# Patient Record
Sex: Female | Born: 1960 | Race: White | Hispanic: No | Smoking: Current every day smoker
Health system: Southern US, Community
[De-identification: ages and names within clinical notes are randomized; demographics above are authoritative.]

## PROBLEM LIST (undated history)

## (undated) DIAGNOSIS — M1990 Unspecified osteoarthritis, unspecified site: Secondary | ICD-10-CM

## (undated) DIAGNOSIS — M810 Age-related osteoporosis without current pathological fracture: Secondary | ICD-10-CM

## (undated) DIAGNOSIS — M719 Bursopathy, unspecified: Secondary | ICD-10-CM

## (undated) DIAGNOSIS — J439 Emphysema, unspecified: Secondary | ICD-10-CM

## (undated) DIAGNOSIS — M419 Scoliosis, unspecified: Secondary | ICD-10-CM

## (undated) HISTORY — PX: CERVICAL LAMINECTOMY: SHX94

## (undated) HISTORY — DX: Bursopathy, unspecified: M71.9

## (undated) HISTORY — DX: Age-related osteoporosis without current pathological fracture: M81.0

## (undated) HISTORY — DX: Emphysema, unspecified: J43.9

## (undated) HISTORY — DX: Unspecified osteoarthritis, unspecified site: M19.90

## (undated) HISTORY — PX: WRIST SURGERY: SHX841

## (undated) HISTORY — DX: Scoliosis, unspecified: M41.9

---

## 2004-11-11 ENCOUNTER — Emergency Department: Payer: Self-pay | Admitting: Internal Medicine

## 2005-08-06 ENCOUNTER — Emergency Department: Payer: Self-pay | Admitting: Emergency Medicine

## 2005-12-03 ENCOUNTER — Emergency Department: Payer: Self-pay | Admitting: Emergency Medicine

## 2006-01-26 ENCOUNTER — Emergency Department: Payer: Self-pay | Admitting: Internal Medicine

## 2006-03-03 ENCOUNTER — Emergency Department: Payer: Self-pay | Admitting: Emergency Medicine

## 2006-03-25 ENCOUNTER — Emergency Department: Payer: Self-pay | Admitting: General Practice

## 2006-06-03 ENCOUNTER — Emergency Department (HOSPITAL_COMMUNITY): Admission: EM | Admit: 2006-06-03 | Discharge: 2006-06-03 | Payer: Self-pay | Admitting: Emergency Medicine

## 2007-10-01 ENCOUNTER — Emergency Department (HOSPITAL_COMMUNITY): Admission: EM | Admit: 2007-10-01 | Discharge: 2007-10-01 | Payer: Self-pay | Admitting: Emergency Medicine

## 2007-10-08 ENCOUNTER — Inpatient Hospital Stay: Payer: Self-pay | Admitting: Internal Medicine

## 2007-10-21 ENCOUNTER — Emergency Department (HOSPITAL_COMMUNITY): Admission: EM | Admit: 2007-10-21 | Discharge: 2007-10-21 | Payer: Self-pay | Admitting: Emergency Medicine

## 2008-02-17 ENCOUNTER — Emergency Department (HOSPITAL_COMMUNITY): Admission: EM | Admit: 2008-02-17 | Discharge: 2008-02-17 | Payer: Self-pay | Admitting: Family Medicine

## 2008-03-21 ENCOUNTER — Ambulatory Visit (HOSPITAL_COMMUNITY): Admission: RE | Admit: 2008-03-21 | Discharge: 2008-03-21 | Payer: Self-pay | Admitting: Family Medicine

## 2008-03-29 ENCOUNTER — Emergency Department (HOSPITAL_COMMUNITY): Admission: EM | Admit: 2008-03-29 | Discharge: 2008-03-29 | Payer: Self-pay | Admitting: Emergency Medicine

## 2008-04-12 ENCOUNTER — Emergency Department (HOSPITAL_COMMUNITY): Admission: EM | Admit: 2008-04-12 | Discharge: 2008-04-12 | Payer: Self-pay | Admitting: Emergency Medicine

## 2008-04-15 ENCOUNTER — Emergency Department (HOSPITAL_COMMUNITY): Admission: EM | Admit: 2008-04-15 | Discharge: 2008-04-15 | Payer: Self-pay | Admitting: Emergency Medicine

## 2008-07-13 ENCOUNTER — Encounter (HOSPITAL_COMMUNITY): Admission: RE | Admit: 2008-07-13 | Discharge: 2008-08-12 | Payer: Self-pay | Admitting: Anesthesiology

## 2009-04-17 ENCOUNTER — Ambulatory Visit (HOSPITAL_COMMUNITY): Admission: RE | Admit: 2009-04-17 | Discharge: 2009-04-17 | Payer: Self-pay | Admitting: Family Medicine

## 2009-05-25 ENCOUNTER — Ambulatory Visit: Payer: Self-pay | Admitting: Pain Medicine

## 2009-08-06 ENCOUNTER — Emergency Department (HOSPITAL_COMMUNITY): Admission: EM | Admit: 2009-08-06 | Discharge: 2009-08-06 | Payer: Self-pay | Admitting: Emergency Medicine

## 2009-08-11 ENCOUNTER — Emergency Department: Payer: Self-pay | Admitting: Unknown Physician Specialty

## 2010-01-05 ENCOUNTER — Emergency Department (HOSPITAL_COMMUNITY): Admission: EM | Admit: 2010-01-05 | Discharge: 2010-01-05 | Payer: Self-pay | Admitting: Emergency Medicine

## 2010-03-23 ENCOUNTER — Emergency Department (HOSPITAL_COMMUNITY): Admission: EM | Admit: 2010-03-23 | Discharge: 2010-03-23 | Payer: Self-pay | Admitting: Emergency Medicine

## 2010-03-26 ENCOUNTER — Encounter (HOSPITAL_COMMUNITY): Admission: RE | Admit: 2010-03-26 | Discharge: 2010-04-25 | Payer: Self-pay | Admitting: Orthopaedic Surgery

## 2010-04-20 ENCOUNTER — Ambulatory Visit (HOSPITAL_COMMUNITY): Admission: RE | Admit: 2010-04-20 | Discharge: 2010-04-20 | Payer: Self-pay | Admitting: Orthopaedic Surgery

## 2010-06-14 ENCOUNTER — Emergency Department (HOSPITAL_COMMUNITY)
Admission: EM | Admit: 2010-06-14 | Discharge: 2010-06-15 | Payer: Self-pay | Source: Home / Self Care | Admitting: Emergency Medicine

## 2010-07-31 ENCOUNTER — Emergency Department (HOSPITAL_COMMUNITY)
Admission: EM | Admit: 2010-07-31 | Discharge: 2010-07-31 | Disposition: A | Discharge status: Home / Self Care | Payer: Medicaid Other | Attending: Emergency Medicine | Admitting: Emergency Medicine

## 2010-07-31 DIAGNOSIS — F411 Generalized anxiety disorder: Secondary | ICD-10-CM | POA: Insufficient documentation

## 2010-07-31 DIAGNOSIS — K219 Gastro-esophageal reflux disease without esophagitis: Secondary | ICD-10-CM | POA: Insufficient documentation

## 2010-07-31 DIAGNOSIS — M79609 Pain in unspecified limb: Secondary | ICD-10-CM | POA: Insufficient documentation

## 2010-07-31 DIAGNOSIS — M545 Low back pain, unspecified: Secondary | ICD-10-CM | POA: Insufficient documentation

## 2010-07-31 DIAGNOSIS — M810 Age-related osteoporosis without current pathological fracture: Secondary | ICD-10-CM | POA: Insufficient documentation

## 2010-07-31 DIAGNOSIS — F17200 Nicotine dependence, unspecified, uncomplicated: Secondary | ICD-10-CM | POA: Insufficient documentation

## 2010-07-31 DIAGNOSIS — J4489 Other specified chronic obstructive pulmonary disease: Secondary | ICD-10-CM | POA: Insufficient documentation

## 2010-07-31 DIAGNOSIS — M533 Sacrococcygeal disorders, not elsewhere classified: Secondary | ICD-10-CM | POA: Insufficient documentation

## 2010-07-31 DIAGNOSIS — G8929 Other chronic pain: Secondary | ICD-10-CM | POA: Insufficient documentation

## 2010-07-31 DIAGNOSIS — J449 Chronic obstructive pulmonary disease, unspecified: Secondary | ICD-10-CM | POA: Insufficient documentation

## 2010-07-31 DIAGNOSIS — Z79899 Other long term (current) drug therapy: Secondary | ICD-10-CM | POA: Insufficient documentation

## 2010-07-31 DIAGNOSIS — M129 Arthropathy, unspecified: Secondary | ICD-10-CM | POA: Insufficient documentation

## 2010-07-31 DIAGNOSIS — M5430 Sciatica, unspecified side: Secondary | ICD-10-CM | POA: Insufficient documentation

## 2010-07-31 LAB — URINALYSIS, ROUTINE W REFLEX MICROSCOPIC
Ketones, ur: NEGATIVE mg/dL
Leukocytes, UA: NEGATIVE
Protein, ur: NEGATIVE mg/dL
Urine Glucose, Fasting: NEGATIVE mg/dL
Urobilinogen, UA: 0.2 mg/dL (ref 0.0–1.0)

## 2010-07-31 LAB — POCT PREGNANCY, URINE: Preg Test, Ur: NEGATIVE

## 2010-07-31 LAB — URINE MICROSCOPIC-ADD ON

## 2010-08-03 LAB — URINE CULTURE
Colony Count: >=100000
Culture  Setup Time: 201202082026

## 2010-08-17 ENCOUNTER — Emergency Department (HOSPITAL_COMMUNITY): Payer: Medicaid Other

## 2010-08-17 ENCOUNTER — Emergency Department (HOSPITAL_COMMUNITY)
Admission: EM | Admit: 2010-08-17 | Discharge: 2010-08-17 | Disposition: A | Discharge status: Home / Self Care | Payer: Medicaid Other | Attending: Emergency Medicine | Admitting: Emergency Medicine

## 2010-08-17 DIAGNOSIS — W19XXXA Unspecified fall, initial encounter: Secondary | ICD-10-CM | POA: Insufficient documentation

## 2010-08-17 DIAGNOSIS — S139XXA Sprain of joints and ligaments of unspecified parts of neck, initial encounter: Secondary | ICD-10-CM | POA: Insufficient documentation

## 2010-08-17 DIAGNOSIS — Y92009 Unspecified place in unspecified non-institutional (private) residence as the place of occurrence of the external cause: Secondary | ICD-10-CM | POA: Insufficient documentation

## 2010-08-17 DIAGNOSIS — S9030XA Contusion of unspecified foot, initial encounter: Secondary | ICD-10-CM | POA: Insufficient documentation

## 2010-08-17 DIAGNOSIS — S0990XA Unspecified injury of head, initial encounter: Secondary | ICD-10-CM | POA: Insufficient documentation

## 2010-08-17 DIAGNOSIS — S20229A Contusion of unspecified back wall of thorax, initial encounter: Secondary | ICD-10-CM | POA: Insufficient documentation

## 2010-08-17 DIAGNOSIS — IMO0002 Reserved for concepts with insufficient information to code with codable children: Secondary | ICD-10-CM | POA: Insufficient documentation

## 2010-08-17 LAB — DIFFERENTIAL
Eosinophils Absolute: 0.1 10*3/uL (ref 0.0–0.7)
Eosinophils Relative: 1 % (ref 0–5)
Lymphs Abs: 4.6 10*3/uL — ABNORMAL HIGH (ref 0.7–4.0)
Monocytes Relative: 11 % (ref 3–12)

## 2010-08-17 LAB — CBC
MCH: 30.7 pg (ref 26.0–34.0)
MCV: 93 fL (ref 78.0–100.0)
Platelets: 454 10*3/uL — ABNORMAL HIGH (ref 150–400)
RDW: 12.8 % (ref 11.5–15.5)

## 2010-08-17 LAB — BASIC METABOLIC PANEL
BUN: 7 mg/dL (ref 6–23)
Calcium: 9.2 mg/dL (ref 8.4–10.5)
Creatinine, Ser: 0.83 mg/dL (ref 0.4–1.2)
GFR calc non Af Amer: >60 mL/min (ref >60)
Glucose, Bld: 84 mg/dL (ref 70–99)

## 2010-09-13 ENCOUNTER — Emergency Department (HOSPITAL_COMMUNITY)
Admission: EM | Admit: 2010-09-13 | Discharge: 2010-09-14 | Disposition: A | Discharge status: Home / Self Care | Payer: Medicaid Other | Source: Home / Self Care | Attending: Emergency Medicine | Admitting: Emergency Medicine

## 2010-09-13 DIAGNOSIS — IMO0001 Reserved for inherently not codable concepts without codable children: Secondary | ICD-10-CM | POA: Insufficient documentation

## 2010-09-13 DIAGNOSIS — G8929 Other chronic pain: Secondary | ICD-10-CM | POA: Insufficient documentation

## 2010-09-13 DIAGNOSIS — F411 Generalized anxiety disorder: Secondary | ICD-10-CM | POA: Insufficient documentation

## 2010-09-13 DIAGNOSIS — R51 Headache: Secondary | ICD-10-CM | POA: Insufficient documentation

## 2010-09-13 DIAGNOSIS — M25519 Pain in unspecified shoulder: Secondary | ICD-10-CM | POA: Insufficient documentation

## 2010-09-13 DIAGNOSIS — Z79899 Other long term (current) drug therapy: Secondary | ICD-10-CM | POA: Insufficient documentation

## 2010-09-13 DIAGNOSIS — Z8739 Personal history of other diseases of the musculoskeletal system and connective tissue: Secondary | ICD-10-CM | POA: Insufficient documentation

## 2010-09-13 DIAGNOSIS — M810 Age-related osteoporosis without current pathological fracture: Secondary | ICD-10-CM | POA: Insufficient documentation

## 2010-09-13 DIAGNOSIS — J4489 Other specified chronic obstructive pulmonary disease: Secondary | ICD-10-CM | POA: Insufficient documentation

## 2010-09-13 DIAGNOSIS — W19XXXA Unspecified fall, initial encounter: Secondary | ICD-10-CM | POA: Insufficient documentation

## 2010-09-13 DIAGNOSIS — K219 Gastro-esophageal reflux disease without esophagitis: Secondary | ICD-10-CM | POA: Insufficient documentation

## 2010-09-13 DIAGNOSIS — J449 Chronic obstructive pulmonary disease, unspecified: Secondary | ICD-10-CM | POA: Insufficient documentation

## 2010-09-13 DIAGNOSIS — M79609 Pain in unspecified limb: Secondary | ICD-10-CM | POA: Insufficient documentation

## 2010-09-13 DIAGNOSIS — M5430 Sciatica, unspecified side: Secondary | ICD-10-CM | POA: Insufficient documentation

## 2010-09-13 DIAGNOSIS — M542 Cervicalgia: Secondary | ICD-10-CM | POA: Insufficient documentation

## 2010-09-14 ENCOUNTER — Emergency Department (HOSPITAL_COMMUNITY)
Admission: EM | Admit: 2010-09-14 | Discharge: 2010-09-14 | Disposition: A | Discharge status: Home / Self Care | Payer: Medicaid Other | Attending: Emergency Medicine | Admitting: Emergency Medicine

## 2010-09-14 ENCOUNTER — Emergency Department (HOSPITAL_COMMUNITY): Payer: Medicaid Other

## 2011-05-10 ENCOUNTER — Emergency Department: Payer: Self-pay | Admitting: Emergency Medicine

## 2011-07-30 ENCOUNTER — Emergency Department (HOSPITAL_COMMUNITY)
Admission: EM | Admit: 2011-07-30 | Discharge: 2011-07-30 | Disposition: A | Discharge status: Home / Self Care | Payer: Medicaid Other | Attending: Emergency Medicine | Admitting: Emergency Medicine

## 2011-07-30 ENCOUNTER — Emergency Department (HOSPITAL_COMMUNITY): Payer: Medicaid Other

## 2011-07-30 ENCOUNTER — Encounter (HOSPITAL_COMMUNITY): Payer: Self-pay | Admitting: *Deleted

## 2011-07-30 DIAGNOSIS — R0789 Other chest pain: Secondary | ICD-10-CM

## 2011-07-30 DIAGNOSIS — R109 Unspecified abdominal pain: Secondary | ICD-10-CM | POA: Insufficient documentation

## 2011-07-30 DIAGNOSIS — M542 Cervicalgia: Secondary | ICD-10-CM | POA: Insufficient documentation

## 2011-07-30 DIAGNOSIS — R10812 Left upper quadrant abdominal tenderness: Secondary | ICD-10-CM | POA: Insufficient documentation

## 2011-07-30 DIAGNOSIS — R071 Chest pain on breathing: Secondary | ICD-10-CM | POA: Insufficient documentation

## 2011-07-30 DIAGNOSIS — F17200 Nicotine dependence, unspecified, uncomplicated: Secondary | ICD-10-CM | POA: Insufficient documentation

## 2011-07-30 LAB — COMPREHENSIVE METABOLIC PANEL
Albumin: 4.3 g/dL (ref 3.5–5.2)
BUN: 15 mg/dL (ref 6–23)
CO2: 26 mEq/L (ref 19–32)
Chloride: 99 mEq/L (ref 96–112)
Creatinine, Ser: 0.63 mg/dL (ref 0.50–1.10)
GFR calc Af Amer: >90 mL/min (ref >90)
GFR calc non Af Amer: >90 mL/min (ref >90)
Glucose, Bld: 100 mg/dL — ABNORMAL HIGH (ref 70–99)
Total Bilirubin: 0.3 mg/dL (ref 0.3–1.2)

## 2011-07-30 LAB — URINALYSIS, ROUTINE W REFLEX MICROSCOPIC
Ketones, ur: NEGATIVE mg/dL
Leukocytes, UA: NEGATIVE
Nitrite: NEGATIVE
Protein, ur: NEGATIVE mg/dL
Urobilinogen, UA: 0.2 mg/dL (ref 0.0–1.0)

## 2011-07-30 LAB — DIFFERENTIAL
Basophils Relative: 0 % (ref 0–1)
Monocytes Absolute: 1.1 10*3/uL — ABNORMAL HIGH (ref 0.1–1.0)
Monocytes Relative: 10 % (ref 3–12)
Neutro Abs: 5.4 10*3/uL (ref 1.7–7.7)

## 2011-07-30 LAB — LIPASE, BLOOD: Lipase: 54 U/L (ref 11–59)

## 2011-07-30 LAB — CBC
HCT: 39.5 % (ref 36.0–46.0)
Hemoglobin: 12.9 g/dL (ref 12.0–15.0)
MCHC: 32.7 g/dL (ref 30.0–36.0)

## 2011-07-30 MED ORDER — MORPHINE SULFATE 4 MG/ML IJ SOLN
6.0000 mg | Freq: Once | INTRAMUSCULAR | Status: AC
  Administered 2011-07-30: 6 mg via INTRAVENOUS
  Filled 2011-07-30: qty 2

## 2011-07-30 MED ORDER — MORPHINE SULFATE 4 MG/ML IJ SOLN: 6.0000 mg | Freq: Once | INTRAMUSCULAR | Status: DC

## 2011-07-30 MED ORDER — OXYCODONE-ACETAMINOPHEN 5-325 MG PO TABS: 1.0000 | ORAL_TABLET | Freq: Four times a day (QID) | ORAL | Status: AC | PRN

## 2011-07-30 MED ORDER — HYDROMORPHONE HCL PF 1 MG/ML IJ SOLN
1.0000 mg | Freq: Once | INTRAMUSCULAR | Status: DC
  Filled 2011-07-30: qty 1

## 2011-07-30 MED ORDER — ONDANSETRON HCL 4 MG/2ML IJ SOLN
4.0000 mg | Freq: Once | INTRAMUSCULAR | Status: AC
  Administered 2011-07-30: 4 mg via INTRAVENOUS
  Filled 2011-07-30: qty 2

## 2011-07-30 MED ORDER — MORPHINE SULFATE 4 MG/ML IJ SOLN
6.0000 mg | Freq: Once | INTRAMUSCULAR | Status: AC
  Administered 2011-07-30: 6 mg via INTRAVENOUS
  Filled 2011-07-30: qty 1

## 2011-07-30 MED ORDER — SODIUM CHLORIDE 0.9 % IV SOLN
Freq: Once | INTRAVENOUS | Status: AC
  Administered 2011-07-30: 16:00:00 via INTRAVENOUS

## 2011-07-30 NOTE — ED Provider Notes (Signed)
History    This chart was scribed for Laurie Lennert, MD, MD by Laurie Robles. The patient was seen in room APA10 and the patient's care was started at 3:45PM.   CSN: [id number]  Arrival date & time 07/30/11  1513   First MD Initiated Contact with Patient 07/30/11 1542      Chief Complaint  Patient presents with  . Nephrolithiasis    (Consider location/radiation/quality/duration/timing/severity/associated sxs/prior treatment) Patient is a 51 y.o. female presenting with flank pain. The history is provided by the patient.  Flank Pain This is a new problem. The current episode started more than 2 days ago. The problem occurs constantly. The problem has not changed since onset.Pertinent negatives include no chest pain. She has tried nothing for the symptoms.   Laurie Robles is a 51 y.o. female who presents to the Emergency Department complaining of moderate nephrolithiasis onset 1 week ago. Pt reports going to PCP yesterday and had ultrasound results that showed kidney stones. Pt reports breathing deeply aggravates the pain. Pt reports vomiting a couple of times last week. She denies having history of kidney stones.   History reviewed. No pertinent past medical history.  Past Surgical History  Procedure Date  . Cervical laminectomy     History reviewed. No pertinent family history.  History  Substance Use Topics  . Smoking status: Current Everyday Smoker  . Smokeless tobacco: Not on file  . Alcohol Use: No    OB History    Grav Para Term Preterm Abortions TAB SAB Ect Mult Living                  Review of Systems  Cardiovascular: Negative for chest pain.  Genitourinary: Positive for flank pain.  All other systems reviewed and are negative.  10 Systems reviewed and are negative for acute change except as noted in the HPI.   Allergies  Review of patient's allergies indicates not on file.  Home Medications  No current outpatient prescriptions on file.  BP 153/114   Pulse 106  Temp(Src) 98 F (36.7 C) (Oral)  Resp 24  Ht 5\' 3"  (1.6 m)  Wt 126 lb (57.153 kg)  BMI 22.32 kg/m2  SpO2 100%  Physical Exam  Nursing note and vitals reviewed. Constitutional: She is oriented to person, place, and time. She appears well-developed and well-nourished. No distress.  HENT:  Head: Normocephalic and atraumatic.  Eyes: Conjunctivae and EOM are normal. No scleral icterus.  Neck: Neck supple. No thyromegaly present.  Cardiovascular: Normal rate and regular rhythm.  Exam reveals no gallop and no friction rub.   No murmur heard. Pulmonary/Chest: No stridor. She has no wheezes. She has no rales. She exhibits no tenderness.  Abdominal: She exhibits no distension. There is tenderness (LUQ). There is no rebound.  Musculoskeletal: Normal range of motion. She exhibits no edema.  Lymphadenopathy:    She has no cervical adenopathy.  Neurological: She is oriented to person, place, and time. Coordination normal.  Skin: No rash noted. No erythema.  Psychiatric: She has a normal mood and affect. Her behavior is normal.    ED Course  Procedures (including critical care time)  DIAGNOSTIC STUDIES: Oxygen Saturation is 100% on room air, normal by my interpretation.    COORDINATION OF CARE:  3:50 PM EDP ordered medication: dilaudid 1 mg and zofran 4 mg  Labs Reviewed  DIFFERENTIAL - Abnormal; Notable for the following:    Monocytes Absolute 1.1 (*)    All other components within  normal limits  COMPREHENSIVE METABOLIC PANEL - Abnormal; Notable for the following:    Glucose, Bld 100 (*)    All other components within normal limits  URINALYSIS, ROUTINE W REFLEX MICROSCOPIC - Abnormal; Notable for the following:    Specific Gravity, Urine >1.030 (*)    Hgb urine dipstick SMALL (*)    All other components within normal limits  URINE MICROSCOPIC-ADD ON - Abnormal; Notable for the following:    Squamous Epithelial / LPF FEW (*)    All other components within normal limits    CBC  LIPASE, BLOOD   Ct Abdomen Pelvis Wo Contrast  07/30/2011  *RADIOLOGY REPORT*  Clinical Data: Low back pain post fall.  History of nephrolithiasis.  CT ABDOMEN AND PELVIS WITHOUT CONTRAST  Technique:  Multidetector CT imaging of the abdomen and pelvis was performed following the standard protocol without intravenous contrast.  Comparison: None.  Findings: Visualized lung bases clear.  Unremarkable uninfused evaluation of liver, gallbladder, spleen, adrenal glands, pancreas.  No nephrolithiasis.  No hydronephrosis.  Renal parenchyma unremarkable.  Ureters decompressed.  Urinary bladder physiologically distended.  Small bowel and stomach are decompressed. Normal appendix.  The colon is nondilated, unremarkable.  Left pelvic phleboliths. Uterus and adnexal regions unremarkable.  No ascites.  No free air. Minimal calcified plaque in the abdominal aorta without aneurysm. Lumbar spine and pelvis intact.  IMPRESSION:  1.  No acute abdominal process. 2. Negative for nephrolithiasis or hydronephrosis.  Original Report Authenticated By: Osa Craver, M.D.   Ct Cervical Spine Wo Contrast  07/30/2011  *RADIOLOGY REPORT*  Clinical Data: Larey Seat, neck pain.  Previous surgery.  CT CERVICAL SPINE WITHOUT CONTRAST  Technique:  Multidetector CT imaging of the cervical spine was performed. Multiplanar CT image reconstructions were also generated.  Comparison: 09/14/2010.  Findings: The patient is status post C4-C7 ACDF with anterior plating.  As demonstrated previously, the C4-5 and C5-6 levels are solidly fused.  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  Both C7 screws are loose and the right screw is fractured.  This is not an acute finding.  There is no visible cervical spine fracture or traumatic subluxation.  No neck masses are seen.  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  IMPRESSION: No acute cervical spine fracture.  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  Original Report  Authenticated By: Elsie Stain, M.D.     No diagnosis found.  Chest wall pain  MDM  Chest wall pain Patient was examined at discharge. Patient was still having some pain in her left anterior distal chest and some in her left flank. Patient was very tender to palpation to her chest and flank.       Laurie Lennert, MD 07/30/11 1901

## 2011-07-30 NOTE — ED Notes (Signed)
edp aware pt would like to speak with him. Nad.

## 2011-07-30 NOTE — ED Notes (Signed)
U/s done yesterday and shown to have kidney stones on left.

## 2011-10-02 IMAGING — CR DG LUMBAR SPINE COMPLETE 4+V
5 series · 5 of 5 positions shown · non-contrast
Comparison: None.

CLINICAL DATA: Fall.  Low back injury and pain.

LUMBAR SPINE - COMPLETE 4+ VIEW

[view not recorded (1 of 5)]
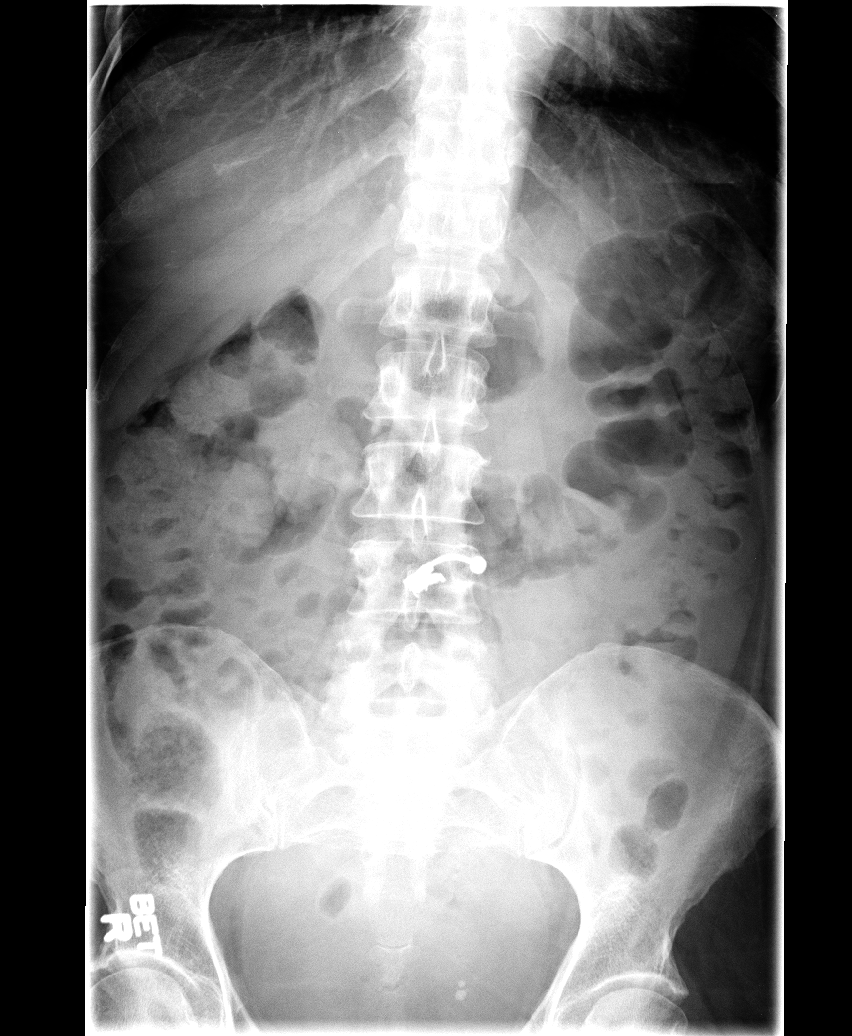

[view not recorded (2 of 5)]
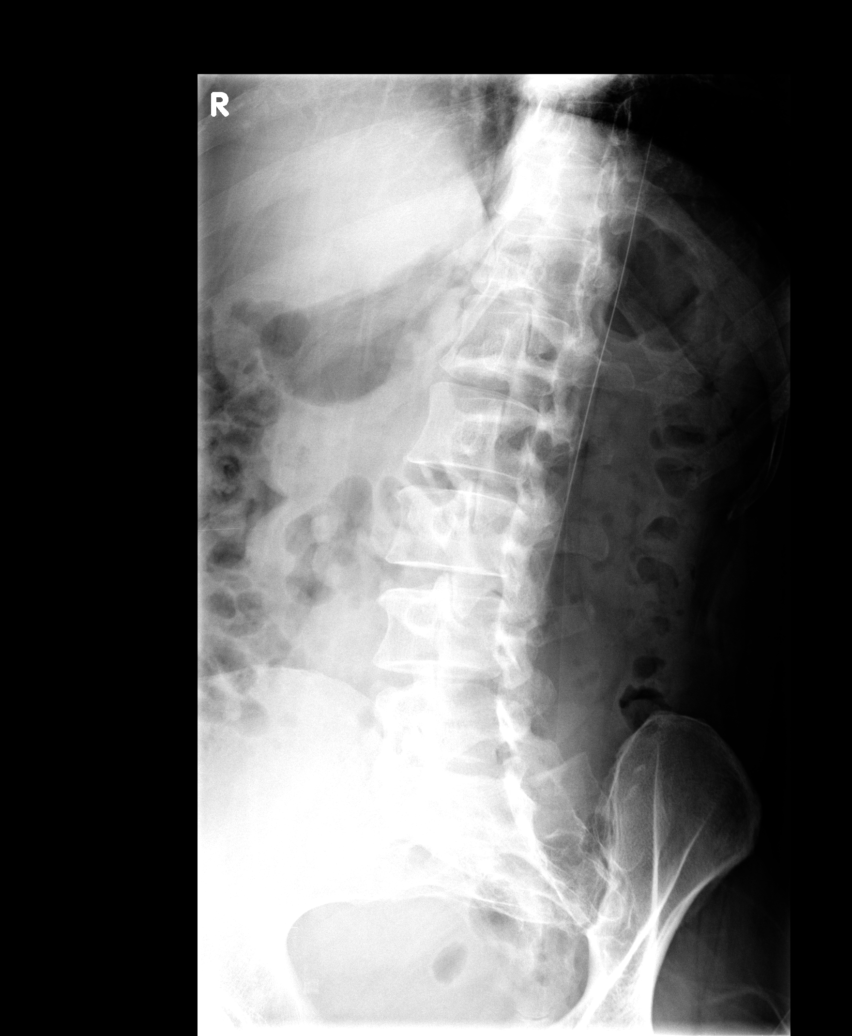

[view not recorded (3 of 5)]
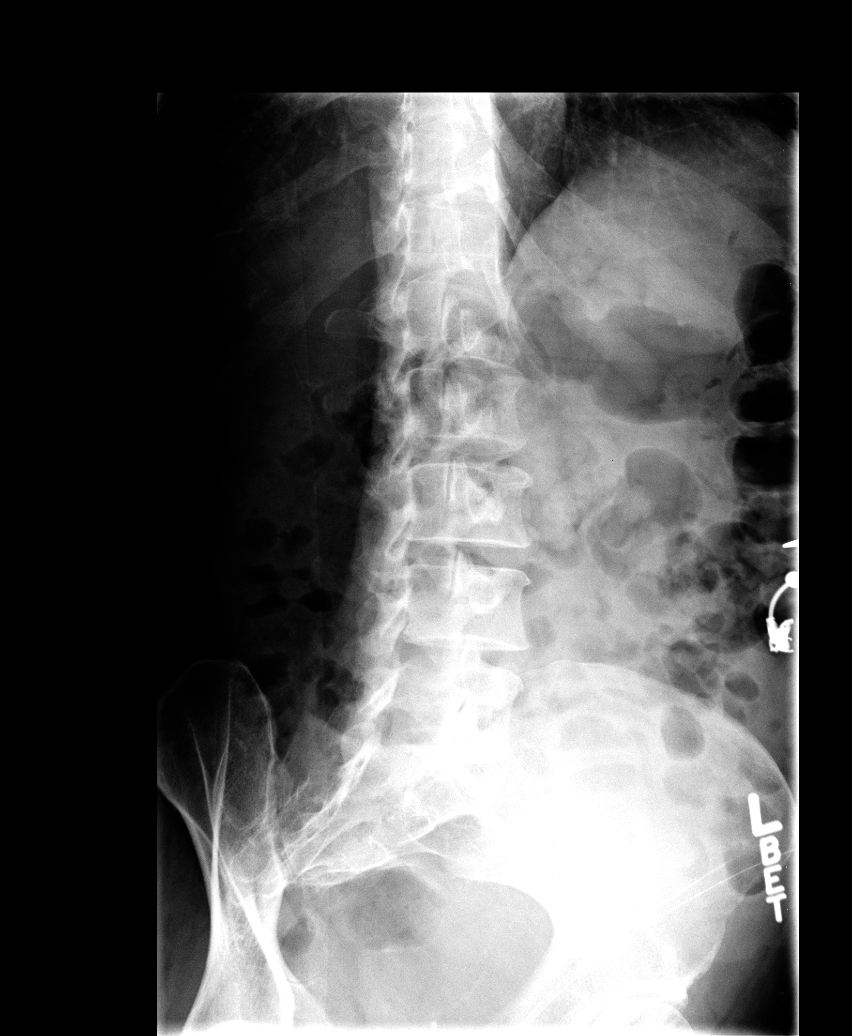

[view not recorded (4 of 5)]
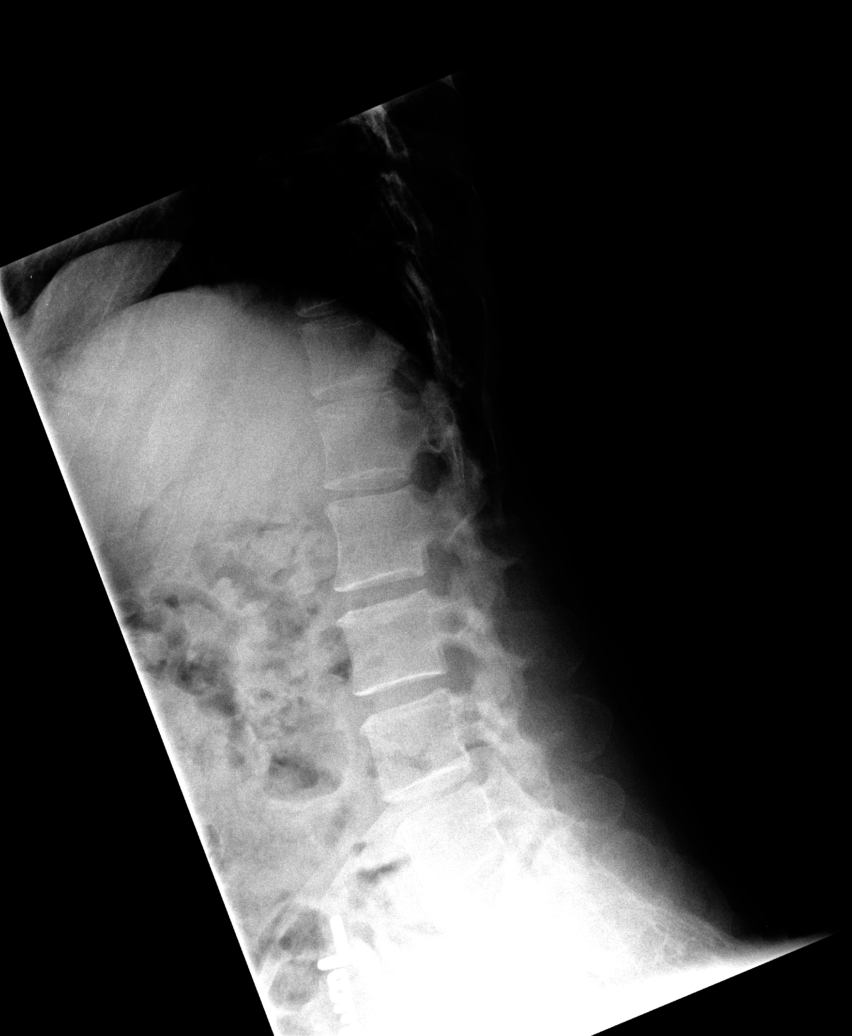

[view not recorded (5 of 5)]
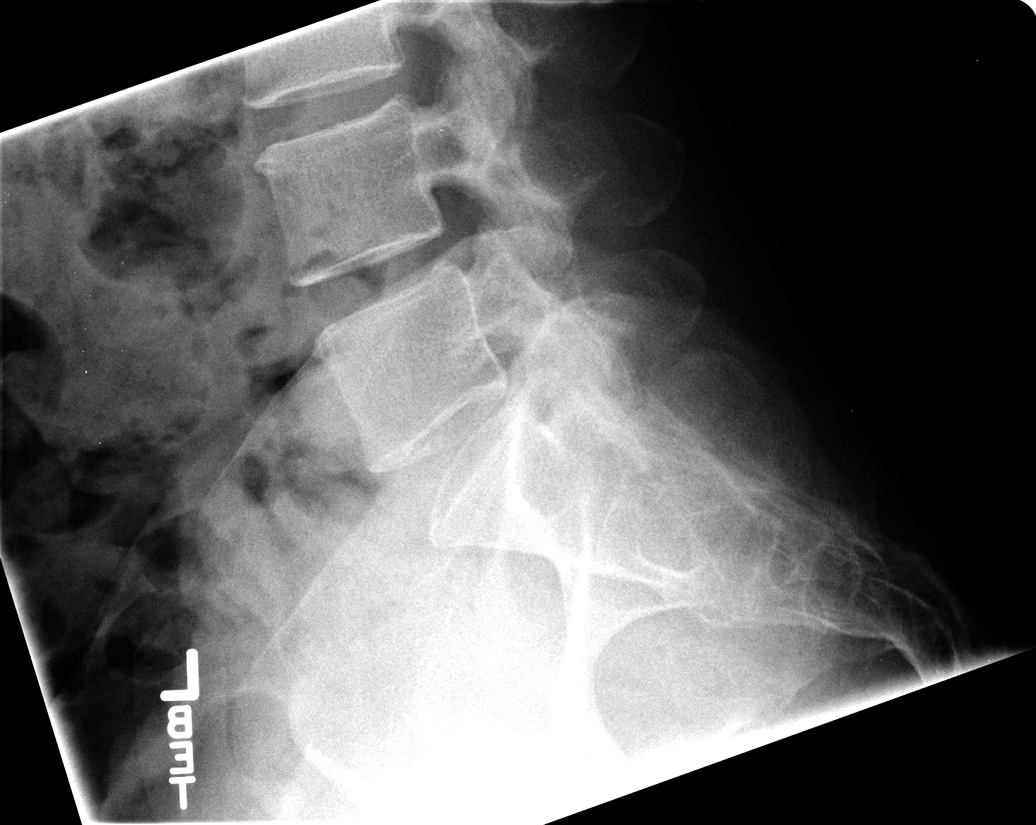

[5 of 5 positions shown; findings below may reference images not displayed]

FINDINGS: No evidence of acute fracture, spondylolysis, or
spondylolisthesis.

Mild vertebral endplate osteophyte formation is seen without disc
space narrowing.  No evidence of facet arthropathy or other
significant bone abnormality.
IMPRESSION: 1.  No acute findings.
2.  Early lumbar degenerative changes.

## 2013-11-19 ENCOUNTER — Emergency Department: Payer: Self-pay | Admitting: Emergency Medicine

## 2014-02-05 ENCOUNTER — Emergency Department: Payer: Self-pay | Admitting: Student

## 2014-02-05 LAB — BASIC METABOLIC PANEL
ANION GAP: 5 — AB (ref 7–16)
BUN: 10 mg/dL (ref 7–18)
CHLORIDE: 105 mmol/L (ref 98–107)
CREATININE: 0.88 mg/dL (ref 0.60–1.30)
Calcium, Total: 9 mg/dL (ref 8.5–10.1)
Co2: 29 mmol/L (ref 21–32)
EGFR (African American): >60
Glucose: 93 mg/dL (ref 65–99)
OSMOLALITY: 276 (ref 275–301)
Potassium: 3.5 mmol/L (ref 3.5–5.1)
SODIUM: 139 mmol/L (ref 136–145)

## 2014-02-05 LAB — ETHANOL
Ethanol %: 0.13 % — ABNORMAL HIGH (ref 0.000–0.080)
Ethanol: 130 mg/dL

## 2014-02-05 LAB — CBC
HCT: 38.1 % (ref 35.0–47.0)
HGB: 12.3 g/dL (ref 12.0–16.0)
MCH: 31.4 pg (ref 26.0–34.0)
MCHC: 32.4 g/dL (ref 32.0–36.0)
MCV: 97 fL (ref 80–100)
Platelet: 344 10*3/uL (ref 150–440)
RBC: 3.93 10*6/uL (ref 3.80–5.20)
RDW: 13.4 % (ref 11.5–14.5)
WBC: 11.8 10*3/uL — ABNORMAL HIGH (ref 3.6–11.0)

## 2015-03-08 ENCOUNTER — Encounter: Payer: Self-pay | Admitting: Emergency Medicine

## 2015-03-08 ENCOUNTER — Emergency Department
Admission: EM | Admit: 2015-03-08 | Discharge: 2015-03-08 | Disposition: A | Discharge status: Home / Self Care | Payer: Medicaid Other | Attending: Student | Admitting: Student

## 2015-03-08 ENCOUNTER — Emergency Department: Payer: Medicaid Other

## 2015-03-08 DIAGNOSIS — Z720 Tobacco use: Secondary | ICD-10-CM | POA: Diagnosis not present

## 2015-03-08 DIAGNOSIS — S92002A Unspecified fracture of left calcaneus, initial encounter for closed fracture: Secondary | ICD-10-CM | POA: Diagnosis not present

## 2015-03-08 DIAGNOSIS — F419 Anxiety disorder, unspecified: Secondary | ICD-10-CM | POA: Diagnosis not present

## 2015-03-08 DIAGNOSIS — X58XXXA Exposure to other specified factors, initial encounter: Secondary | ICD-10-CM | POA: Insufficient documentation

## 2015-03-08 DIAGNOSIS — Y9389 Activity, other specified: Secondary | ICD-10-CM | POA: Insufficient documentation

## 2015-03-08 DIAGNOSIS — Z880 Allergy status to penicillin: Secondary | ICD-10-CM | POA: Insufficient documentation

## 2015-03-08 DIAGNOSIS — Z7951 Long term (current) use of inhaled steroids: Secondary | ICD-10-CM | POA: Insufficient documentation

## 2015-03-08 DIAGNOSIS — Z79899 Other long term (current) drug therapy: Secondary | ICD-10-CM | POA: Diagnosis not present

## 2015-03-08 DIAGNOSIS — Y998 Other external cause status: Secondary | ICD-10-CM | POA: Diagnosis not present

## 2015-03-08 DIAGNOSIS — Z792 Long term (current) use of antibiotics: Secondary | ICD-10-CM | POA: Insufficient documentation

## 2015-03-08 DIAGNOSIS — S9032XA Contusion of left foot, initial encounter: Secondary | ICD-10-CM | POA: Diagnosis not present

## 2015-03-08 DIAGNOSIS — Y92832 Beach as the place of occurrence of the external cause: Secondary | ICD-10-CM | POA: Diagnosis not present

## 2015-03-08 DIAGNOSIS — S8992XA Unspecified injury of left lower leg, initial encounter: Secondary | ICD-10-CM | POA: Diagnosis present

## 2015-03-08 MED ORDER — OXYCODONE-ACETAMINOPHEN 5-325 MG PO TABS
1.0000 | ORAL_TABLET | Freq: Once | ORAL | Status: AC
  Administered 2015-03-08: 1 via ORAL
  Filled 2015-03-08: qty 1

## 2015-03-08 MED ORDER — OXYCODONE-ACETAMINOPHEN 7.5-325 MG PO TABS: 1.0000 | ORAL_TABLET | Freq: Four times a day (QID) | ORAL | Status: DC | PRN

## 2015-03-08 MED ORDER — IBUPROFEN 800 MG PO TABS
800.0000 mg | ORAL_TABLET | Freq: Once | ORAL | Status: AC
  Administered 2015-03-08: 800 mg via ORAL
  Filled 2015-03-08: qty 1

## 2015-03-08 MED ORDER — IBUPROFEN 800 MG PO TABS: 800.0000 mg | ORAL_TABLET | Freq: Three times a day (TID) | ORAL | Status: DC | PRN

## 2015-03-08 NOTE — Discharge Instructions (Signed)
Use crutches and follow up with [facility].

## 2015-03-08 NOTE — ED Notes (Signed)
Pt states [place], states she ran out of pain meds.

## 2015-03-08 NOTE — ED Provider Notes (Signed)
[facility] Emergency Department Provider Note  ____________________________________________  Time seen: Approximately 2:04 PM  I have reviewed the triage vital signs and the nursing notes.   HISTORY  Chief Complaint Leg Pain    HPI Laurie Robles is a 54 y.o. female patient complain of pain to the left foot secondary to blunt [place]. Patient states she was seen at urgent care clinic x-ray showed a broken foot. Patient has the x-ray films on the disks. She does not follow orthopedics and states she is out of pain medications. Patient is rating the pain as 8/10.   History reviewed. No pertinent past medical history.  There are no active problems to display for this patient.   Past Surgical History  Procedure Laterality Date  . Cervical laminectomy      Current Outpatient Rx  Name  Route  Sig  Dispense  Refill  . Aclidinium Bromide (TUDORZA PRESSAIR) 400 MCG/ACT AEPB   Inhalation   Inhale 1 puff into the lungs 2 (two) times daily.         Marland Kitchen ALPRAZolam (XANAX) 1 MG tablet   Oral   Take 1 mg by mouth 4 (four) times daily.         Marland Kitchen arformoterol (BROVANA) 15 MCG/2ML NEBU   Nebulization   Take 15 mcg by nebulization 2 (two) times daily.         . carisoprodol (SOMA) 350 MG tablet   Oral   Take 350 mg by mouth 4 (four) times daily.         . cetirizine (ZYRTEC) 10 MG tablet   Oral   Take 10 mg by mouth daily.         . ciprofloxacin (CIPRO) 500 MG tablet   Oral   Take 500 mg by mouth 2 (two) times daily.         Marland Kitchen desvenlafaxine (PRISTIQ) 50 MG 24 hr tablet   Oral   Take 50 mg by mouth 2 (two) times daily.         Marland Kitchen estrogen, conjugated,-medroxyprogesterone (PREMPRO) 0.625-2.5 MG per tablet   Oral   Take 1 tablet by mouth every evening.         . etodolac (LODINE) 400 MG tablet   Oral   Take 400 mg by mouth 2 (two) times daily. **takes as needed for pain**         .  HYDROcodone-acetaminophen (NORCO) 7.5-325 MG per tablet   Oral   Take 1 tablet by mouth every 4 (four) hours as needed. For pain. *Must last 30 days**         . ibuprofen (ADVIL,MOTRIN) 800 MG tablet   Oral   Take 1 tablet (800 mg total) by mouth every 8 (eight) hours as needed for moderate pain.   15 tablet   0   . olopatadine (PATANOL) 0.1 % ophthalmic solution   Both Eyes   Place 1 drop into both eyes 2 (two) times daily.         . Olopatadine HCl (PATADAY) 0.2 % SOLN   Ophthalmic   Apply 1 drop to eye 2 (two) times daily.         Marland Kitchen omeprazole (PRILOSEC) 20 MG capsule   Oral   Take 20 mg by mouth daily.         Marland Kitchen oxyCODONE-acetaminophen (PERCOCET) 7.5-325 MG per tablet   Oral   Take 1 tablet by mouth every 6 (  six) hours as needed for severe pain.   12 tablet   0   . pregabalin (LYRICA) 300 MG capsule   Oral   Take 300 mg by mouth 4 (four) times daily.         . traMADol (ULTRAM) 50 MG tablet   Oral   Take 50 mg by mouth 4 (four) times daily as needed. For pain/headaches           Allergies Dilaudid; Erythromycin; and Penicillins  History reviewed. No pertinent family history.  Social History Social History  Substance Use Topics  . Smoking status: Current Every Day Smoker  . Smokeless tobacco: None  . Alcohol Use: No    Review of Systems Constitutional: No fever/chills Eyes: No visual changes. ENT: No sore throat. Cardiovascular: Denies chest pain. Respiratory: Denies shortness of breath. Gastrointestinal: No abdominal pain.  No nausea, no vomiting.  No diarrhea.  No constipation. Genitourinary: Negative for dysuria. Musculoskeletal: Left foot pain Skin: Negative for rash. Neurological: Negative for headaches, focal weakness or numbness. Psychiatric:Anxiety Endocrine: Hematological/Lymphatic: Allergic/Immunilogical: The medication list  10-point ROS otherwise negative.  ____________________________________________   PHYSICAL  EXAM:  VITAL SIGNS: ED Triage Vitals  Enc Vitals Group     BP 03/08/15 1217 145/84 mmHg     Pulse Rate 03/08/15 1217 102     Resp 03/08/15 1217 18     Temp 03/08/15 1217 98.7 F (37.1 C)     Temp Source 03/08/15 1217 Oral     SpO2 03/08/15 1217 100 %     Weight 03/08/15 1217 112 lb (50.803 kg)     Height 03/08/15 1217 5\' 3"  (1.6 m)     Head Cir --      Peak Flow --      Pain Score 03/08/15 1217 10     Pain Loc --      Pain Edu? --      Excl. in GC? --     Constitutional: Alert and oriented. Well appearing and in no acute distress. Anxious Eyes: Conjunctivae are normal. PERRL. EOMI. Head: Atraumatic. Nose: No congestion/rhinnorhea. Mouth/Throat: Mucous membranes are moist.  Oropharynx non-erythematous. Neck: No stridor.   Hematological/Lymphatic/Immunilogical: No cervical lymphadenopathy. Cardiovascular: Normal rate, regular rhythm. Grossly normal heart sounds.  Good peripheral circulation. Respiratory: Normal respiratory effort.  No retractions. Lungs CTAB. Gastrointestinal: Soft and nontender. No distention. No abdominal bruits. No CVA tenderness. Musculoskeletal: He edema left foot with no obvious deformity.  Neurologic:  Normal speech and language. No gross focal neurologic deficits are appreciated. No gait instability. Skin:  Skin is warm, dry and intact. No rash noted. Ecchymosis Psychiatric: Mood and affect are normal. Speech and behavior are normal.  ____________________________________________   LABS (all labs ordered are listed, but only abnormal results are displayed)  Labs Reviewed - No data to display ____________________________________________  EKG   ____________________________________________  RADIOLOGY plantar calcaneal fracture. I, Joni Reining, personally viewed and evaluated these images (plain radiographs) as part of my medical decision making.   ____________________________________________   PROCEDURES  Procedure(s) performed:  None  Critical Care performed: No  ____________________________________________   INITIAL IMPRESSION / ASSESSMENT AND PLAN / ED COURSE  Pertinent labs & imaging results that were available during my care of the patient were reviewed by me and considered in my medical decision making (see chart for details).  Left fractured heel. Discussed negative tib-fib fracture with patient. Advised patient to follow-up in orthopedic. Advised to call in the morning for appointment time. Patient will  replace on the non-weightbearing status and given Percocets and ibuprofen. ____________________________________________   FINAL CLINICAL IMPRESSION(S) / ED DIAGNOSES  Final diagnoses:  Heel fracture, left, closed, initial encounter      Joni Reining, PA-C 03/08/15 1531  Gayla Doss, MD 03/08/15 1555

## 2015-04-05 ENCOUNTER — Encounter: Payer: Self-pay | Admitting: Emergency Medicine

## 2015-04-05 ENCOUNTER — Emergency Department: Payer: Medicaid Other

## 2015-04-05 ENCOUNTER — Emergency Department
Admission: EM | Admit: 2015-04-05 | Discharge: 2015-04-05 | Disposition: A | Discharge status: Home / Self Care | Payer: Medicaid Other | Attending: Emergency Medicine | Admitting: Emergency Medicine

## 2015-04-05 DIAGNOSIS — X58XXXD Exposure to other specified factors, subsequent encounter: Secondary | ICD-10-CM | POA: Insufficient documentation

## 2015-04-05 DIAGNOSIS — S92002D Unspecified fracture of left calcaneus, subsequent encounter for fracture with routine healing: Secondary | ICD-10-CM | POA: Diagnosis not present

## 2015-04-05 DIAGNOSIS — Z792 Long term (current) use of antibiotics: Secondary | ICD-10-CM | POA: Insufficient documentation

## 2015-04-05 DIAGNOSIS — S8992XD Unspecified injury of left lower leg, subsequent encounter: Secondary | ICD-10-CM | POA: Diagnosis present

## 2015-04-05 DIAGNOSIS — R110 Nausea: Secondary | ICD-10-CM | POA: Diagnosis not present

## 2015-04-05 DIAGNOSIS — Z880 Allergy status to penicillin: Secondary | ICD-10-CM | POA: Insufficient documentation

## 2015-04-05 DIAGNOSIS — Z79899 Other long term (current) drug therapy: Secondary | ICD-10-CM | POA: Diagnosis not present

## 2015-04-05 DIAGNOSIS — Z720 Tobacco use: Secondary | ICD-10-CM | POA: Insufficient documentation

## 2015-04-05 DIAGNOSIS — Z7951 Long term (current) use of inhaled steroids: Secondary | ICD-10-CM | POA: Diagnosis not present

## 2015-04-05 MED ORDER — OXYCODONE-ACETAMINOPHEN 5-325 MG PO TABS
1.0000 | ORAL_TABLET | Freq: Once | ORAL | Status: AC
  Administered 2015-04-05: 1 via ORAL
  Filled 2015-04-05: qty 1

## 2015-04-05 MED ORDER — OXYCODONE-ACETAMINOPHEN 5-325 MG PO TABS: 1.0000 | ORAL_TABLET | ORAL | Status: DC | PRN

## 2015-04-05 MED ORDER — ONDANSETRON 4 MG PO TBDP
4.0000 mg | ORAL_TABLET | Freq: Once | ORAL | Status: DC
  Filled 2015-04-05: qty 1

## 2015-04-05 NOTE — ED Notes (Signed)
Says she was refused by [facility] and wants her cast off.

## 2015-04-05 NOTE — ED Provider Notes (Signed)
[facility] Emergency Department Provider Note  ____________________________________________  Time seen: Approximately 1:44 PM  I have reviewed the triage vital signs and the nursing notes.   HISTORY  Chief Complaint Leg Pain   HPI Laurie Robles is a 54 y.o. female is here with complaint of left foot pain. Patient states she was seen in the emergency room and also at [facility] for a fractured foot that [place]. Patient states that since that timeshe has lost her footing and has caused a reinjury to her foot. She is here today because she wants her cast taken off and states that she cannot get medication from [facility] as they "have refused her". Patient rates her pain as 8 out of 10 at present.   History reviewed. No pertinent past medical history.  There are no active problems to display for this patient.   Past Surgical History  Procedure Laterality Date  . Cervical laminectomy      Current Outpatient Rx  Name  Route  Sig  Dispense  Refill  . Aclidinium Bromide (TUDORZA PRESSAIR) 400 MCG/ACT AEPB   Inhalation   Inhale 1 puff into the lungs 2 (two) times daily.         Marland Kitchen. ALPRAZolam (XANAX) 1 MG tablet   Oral   Take 1 mg by mouth 4 (four) times daily.         Marland Kitchen. arformoterol (BROVANA) 15 MCG/2ML NEBU   Nebulization   Take 15 mcg by nebulization 2 (two) times daily.         . carisoprodol (SOMA) 350 MG tablet   Oral   Take 350 mg by mouth 4 (four) times daily.         . cetirizine (ZYRTEC) 10 MG tablet   Oral   Take 10 mg by mouth daily.         . ciprofloxacin (CIPRO) 500 MG tablet   Oral   Take 500 mg by mouth 2 (two) times daily.         Marland Kitchen. desvenlafaxine (PRISTIQ) 50 MG 24 hr tablet   Oral   Take 50 mg by mouth 2 (two) times daily.         Marland Kitchen. estrogen, conjugated,-medroxyprogesterone (PREMPRO) 0.625-2.5 MG per tablet   Oral   Take 1 tablet by mouth every evening.         . etodolac  (LODINE) 400 MG tablet   Oral   Take 400 mg by mouth 2 (two) times daily. **takes as needed for pain**         . ibuprofen (ADVIL,MOTRIN) 800 MG tablet   Oral   Take 1 tablet (800 mg total) by mouth every 8 (eight) hours as needed for moderate pain.   15 tablet   0   . olopatadine (PATANOL) 0.1 % ophthalmic solution   Both Eyes   Place 1 drop into both eyes 2 (two) times daily.         . Olopatadine HCl (PATADAY) 0.2 % SOLN   Ophthalmic   Apply 1 drop to eye 2 (two) times daily.         Marland Kitchen. omeprazole (PRILOSEC) 20 MG capsule   Oral   Take 20 mg by mouth daily.         Marland Kitchen. oxyCODONE-acetaminophen (PERCOCET) 5-325 MG tablet   Oral   Take 1 tablet by mouth every 4 (four) hours as needed for severe pain.   20 tablet   0   .  pregabalin (LYRICA) 300 MG capsule   Oral   Take 300 mg by mouth 4 (four) times daily.           Allergies Dilaudid; Erythromycin; and Penicillins  No family history on file.  Social History Social History  Substance Use Topics  . Smoking status: Current Every Day Smoker  . Smokeless tobacco: None  . Alcohol Use: No    Review of Systems Constitutional: No fever/chills Cardiovascular: Denies chest pain. Respiratory: Denies shortness of breath. Gastrointestinal: No abdominal pain. Positive nausea, no vomiting.   Musculoskeletal: Negative for back pain. Positive left foot pain. Skin: Negative for rash. Neurological: Negative for headaches, focal weakness or numbness.  10-point ROS otherwise negative.  ____________________________________________   PHYSICAL EXAM:  VITAL SIGNS: ED Triage Vitals  Enc Vitals Group     BP --      Pulse --      Resp --      Temp --      Temp src --      SpO2 --      Weight --      Height --      Head Cir --      Peak Flow --      Pain Score 04/05/15 1227 8     Pain Loc --      Pain Edu? --      Excl. in GC? --     Constitutional: Alert and oriented. Well appearing and in no acute  distress. Eyes: Conjunctivae are normal. PERRL. EOMI. Head: Atraumatic. Nose: No congestion/rhinnorhea. Neck: No stridor.   Cardiovascular: Normal rate, regular rhythm. Grossly normal heart sounds.  Good peripheral circulation. Refill is less than 2 seconds Respiratory: Normal respiratory effort.  No retractions. Lungs CTAB. Gastrointestinal: Soft and nontender. No distention. Musculoskeletal: Left foot has a cast in place at this time. No lower extremity tenderness nor edema.  No joint effusions. Neurologic:  Normal speech and language. No gross focal neurologic deficits are appreciated. No gait instability. Skin:  Skin is warm, dry and intact. No rash noted. Psychiatric: Mood and affect are normal. Speech and behavior are normal.  ____________________________________________   LABS (all labs ordered are listed, but only abnormal results are displayed)  Labs Reviewed - No data to display RADIOLOGY  Left foot x-ray shows healing calcaneal fracture without gross acute abnormality per radiologist and reviewed by me I, Tommi Rumps, personally viewed and evaluated these images (plain radiographs) as part of my medical decision making.  ____________________________________________   PROCEDURES  Procedure(s) performed: None  Critical Care performed: No  ____________________________________________   INITIAL IMPRESSION / ASSESSMENT AND PLAN / ED COURSE  Pertinent labs & imaging results that were available during my care of the patient were reviewed by me and considered in my medical decision making (see chart for details). patient is to call Dr. Irene Limbo office and make an appointment. Patient was discharged with Percocet as needed for pain. She is also encouraged to elevate her foot to reduce swelling. ____________________________________________   FINAL CLINICAL IMPRESSION(S) / ED DIAGNOSES  Final diagnoses:  Fracture of left calcaneus, with routine healing, subsequent  encounter      Tommi Rumps, PA-C 04/05/15 1533  Jeanmarie Plant, MD 04/11/15 2051

## 2015-09-13 ENCOUNTER — Encounter: Payer: Self-pay | Admitting: Pain Medicine

## 2015-09-13 ENCOUNTER — Ambulatory Visit: Payer: Medicaid Other | Attending: Pain Medicine | Admitting: Pain Medicine

## 2015-09-13 VITALS — BP 122/97 | HR 109 | Temp 97.0°F | Resp 18 | Ht 62.0 in | Wt 108.0 lb

## 2015-09-13 DIAGNOSIS — M25512 Pain in left shoulder: Secondary | ICD-10-CM

## 2015-09-13 DIAGNOSIS — M79604 Pain in right leg: Secondary | ICD-10-CM | POA: Diagnosis not present

## 2015-09-13 DIAGNOSIS — M25511 Pain in right shoulder: Secondary | ICD-10-CM | POA: Insufficient documentation

## 2015-09-13 DIAGNOSIS — M1990 Unspecified osteoarthritis, unspecified site: Secondary | ICD-10-CM | POA: Diagnosis not present

## 2015-09-13 DIAGNOSIS — R52 Pain, unspecified: Secondary | ICD-10-CM

## 2015-09-13 DIAGNOSIS — M79605 Pain in left leg: Secondary | ICD-10-CM | POA: Diagnosis not present

## 2015-09-13 DIAGNOSIS — F17200 Nicotine dependence, unspecified, uncomplicated: Secondary | ICD-10-CM | POA: Diagnosis not present

## 2015-09-13 DIAGNOSIS — F1190 Opioid use, unspecified, uncomplicated: Secondary | ICD-10-CM

## 2015-09-13 DIAGNOSIS — J439 Emphysema, unspecified: Secondary | ICD-10-CM | POA: Diagnosis not present

## 2015-09-13 DIAGNOSIS — X58XXXA Exposure to other specified factors, initial encounter: Secondary | ICD-10-CM | POA: Insufficient documentation

## 2015-09-13 DIAGNOSIS — S92002S Unspecified fracture of left calcaneus, sequela: Secondary | ICD-10-CM | POA: Insufficient documentation

## 2015-09-13 DIAGNOSIS — Z79891 Long term (current) use of opiate analgesic: Secondary | ICD-10-CM | POA: Insufficient documentation

## 2015-09-13 DIAGNOSIS — M79672 Pain in left foot: Secondary | ICD-10-CM | POA: Diagnosis not present

## 2015-09-13 DIAGNOSIS — Z79899 Other long term (current) drug therapy: Secondary | ICD-10-CM

## 2015-09-13 DIAGNOSIS — M545 Low back pain, unspecified: Secondary | ICD-10-CM | POA: Insufficient documentation

## 2015-09-13 DIAGNOSIS — Z0189 Encounter for other specified special examinations: Secondary | ICD-10-CM

## 2015-09-13 DIAGNOSIS — Z650 Conviction in civil and criminal proceedings without imprisonment: Secondary | ICD-10-CM

## 2015-09-13 DIAGNOSIS — M79606 Pain in leg, unspecified: Secondary | ICD-10-CM

## 2015-09-13 DIAGNOSIS — Z9889 Other specified postprocedural states: Secondary | ICD-10-CM

## 2015-09-13 DIAGNOSIS — F419 Anxiety disorder, unspecified: Secondary | ICD-10-CM | POA: Diagnosis not present

## 2015-09-13 DIAGNOSIS — Z5189 Encounter for other specified aftercare: Secondary | ICD-10-CM

## 2015-09-13 DIAGNOSIS — Z5181 Encounter for therapeutic drug level monitoring: Secondary | ICD-10-CM

## 2015-09-13 DIAGNOSIS — G8929 Other chronic pain: Secondary | ICD-10-CM | POA: Insufficient documentation

## 2015-09-13 DIAGNOSIS — M542 Cervicalgia: Secondary | ICD-10-CM | POA: Diagnosis not present

## 2015-09-13 DIAGNOSIS — M5412 Radiculopathy, cervical region: Secondary | ICD-10-CM | POA: Diagnosis not present

## 2015-09-13 DIAGNOSIS — S92002A Unspecified fracture of left calcaneus, initial encounter for closed fracture: Secondary | ICD-10-CM | POA: Insufficient documentation

## 2015-09-13 NOTE — Progress Notes (Signed)
Patient walked up hall.  States that Dr Laban EmperorNaveira had aggravated everything in her and she was going to the ER.  Would not wait to get discharge instructions.  Stated to mail her the information.

## 2015-09-13 NOTE — Progress Notes (Signed)
Patient's Name: Laurie Robles MRN: [id number] DOB: 1961/03/30 DOS: 09/13/2015  Primary Reason(s) for Visit: Initial Patient Evaluation CC: Foot Pain   HPI  Laurie Robles is a 55 y.o. year old, female patient, who comes today for an initial evaluation. She has Chronic pain; Chronic foot pain (Location of Primary Source of Pain) (Left); Healed Calcaneus fracture (Left); Long term current use of opiate analgesic; Chronic neck pain (Location of Tertiary source of pain) (posterior) (Bilateral) (R>L); Cervical radicular pain; Chronic low back pain (Bilateral) (R>L); Foot pain (Left); Chronic shoulder pain (Location of Secondary source of pain) (Bilateral) (R>L); Chronic lower extremity pain (intermittent) (Bilateral) (R>L); Long term prescription opiate use; Opiate use; Encounter for therapeutic drug level monitoring; Encounter for pain management planning; Pain management; Hx of cervical spine surgery; and Convicted for criminal activity (11/28/1993: Illegal position of schedule II drugs) on her problem list.. Her primarily concern today is the Foot Pain   The patient comes into the clinics today for the first time for a chronic pain management evaluation. She indicates that the onset of symptoms was rather subtle them appear while she was [place] vacation in the beach. According to the patient to primary area of pain is that of the left foot where she had her calcaneal fracture. The initial injury appears to have occurred around September 2016.  Further into the evaluation the patient also revealed having long standing history of chronic shoulder pain which is worse on the right side when compared to the left, neck pain with the right being worst on the left, and a prior cervical spine surgery history. She also complained of chronic low back pain with the right being worst on the left, lower extremity pain with the right also being worst on the left, and elbow pain. This elbow pain she indicates that  was secondary to a dog bite where she claims to have "nerve damage". With her left foot pain, she also indicated having "nerve damage". We asked the patient if she had any nerve conduction test done and she indicated that she had not. When I told her that we needed to order some, she indicated that she had no time for that since she had to take care of her dog's. Here are some point were her evaluation became somewhat suspicious. According to our questionnaire the patient indicated no history of ever being accusatory convicted of any crimes however a search of the public defender [place] [employer] [place] came back positive for a 1995 injection for the illegal position of schedule 2 drugs. Despite the fact that we clearly state to all of our new patients that they initial visit is an evaluation only, the patient asked several times about getting pain medication. When she was told that we wouldn't prescribe anything on the first visit she indicated that she could not go have any of the blood work or testing that we ordered because she needed to go to the emergency room to get some pain medication. I find this highly inappropriate.  Reported Pain Score: 6 , clinically she looks like a 2/10. Reported level is inconsistent with clinical obrservations. Pain Type: Chronic pain Pain Location: Foot Pain Orientation: Left Pain Descriptors / Indicators: Aching, Sharp, Shooting, Burning, Stabbing, Tender Pain Frequency: Constant  Onset and Duration: Sudden, Started with accident, Date of onset: September 7th or 8th and Present longer than 3 months Cause of pain: Between September 7th or 8th, she broke her foot against a pier, while on vacation Severity:  Getting better, NAS-11 at its worse: 10/10, NAS-11 at its best: 5/10, NAS-11 now: 7/10 and NAS-11 on the average: 6/10 Timing: Morning, Noon, Afternoon, Evening, Night, Not influenced by the time of the day, During activity or exercise, After activity or exercise  and After a period of immobility Aggravating Factors: Bending, Kneeling, Lifiting, Motion, Prolonged standing, Squatting, Stooping , Twisting, Walking, Walking uphill and Walking downhill Alleviating Factors: Cold packs, Hot packs, Lying down, Resting, Using a brace and Warm showers or baths Associated Problems: Color changes, Day-time cramps, Night-time cramps, Personality changes, Sweating, Swelling, Tingling, Pain that wakes patient up and Pain that does not allow patient to sleep Quality of Pain: Aching, Agonizing, Annoying, Deep, Disabling, Dreadful, Dull, Exhausting, Feeling of weight, Itching, Nagging, Pressure-like, Pulsating, Punishing, Sharp, Shooting, Stabbing, Tender, Toothache-like and Uncomfortable Previous Examinations or Tests: X-rays Previous Treatments: Narcotic medications  Historic Controlled Substance Pharmacotherapy Review  Previously Prescribed Opioids: Currently the patient is not taking any opioids. Some of the opioids taken in the past are as follows: Analgesic: Oxycodone/APAP 7.5/325 up to 6 times a day. (45 mg/day) MME/day: 67.5 mg/day Pharmacokinetics: N/A Pharmacodynamics: N/A Historical Background Evaluation: [place] PDMP: Five (5) year initial data search conducted. Discrepancies found between information provided by the patient and the database.The patient specifically denied having been to other pain clinics, but the database indicates that she has been [facility] the [facility] in [place]. [place] Department Of Public Safety Offender Public Information: Positive for 1995 constriction for illegal position of schedule 2 drugs. Historical Hospital-associated UDS Results:   Lab Results  Component Value Date   ETOH 130 02/05/2014   UDS Results: No UDS available, at this time UDS Interpretation: No UDS available, at this time Medication Assessment Form: Not applicable. Initial evaluation. The patient has not received any medications from  our practice Treatment compliance: Not applicable. Initial evaluation Risk Assessment: Aberrant Behavior: dysfunctional emotional responses, claims that "nothing else works", missing appointments for diagnostic testing, extensive time discussing medication  and aggressive complaining about need for higher doses or stronger medication Opioid Fatal Overdose Risk Factors: Concomitant use of Benzodiazepines Substance Use Disorder (SUD) Risk Level: Pending results of Medical Psychology Evaluation for SUD Opioid Risk Tool (ORT) Score: Total Score: 0 Low Risk for SUD (Score <3) Depression Scale Score: PHQ-2: PHQ-2 Total Score: 0 No depression (0) PHQ-9: PHQ-9 Total Score: 0 No depression (0-4)  Pharmacologic Plan: Pending ordered tests and/or consults  Neuromodulation Therapy Review  Type: No neuromodulatory devices implanted Side-effects or Adverse reactions: No device reported Effectiveness: No device reported  Allergies  Laurie Robles is allergic to dilaudid; hydromorphone; erythromycin; and penicillins.  Meds  The patient has a current medication list which includes the following prescription(s): alprazolam, amphetamine-dextroamphetamine, cetirizine, estrogen (conjugated)-medroxyprogesterone, ibuprofen, olopatadine hcl, and omeprazole. Requested Prescriptions    No prescriptions requested or ordered in this encounter     ROS  Cardiovascular History: Negative for hypertension, coronary artery diseas, myocardial infraction, anticoagulant therapy or heart failure Pulmonary or Respiratory History: Emphysema, Shortness of breath, Smoker, Snoring  and Bronchitis Neurological History: Scoliosis Psychological-Psychiatric History: Anxiety Gastrointestinal History: Reflux or heatburn Genitourinary History: Negative for nephrolithiasis, hematuria, renal failure or chronic kidney disease Hematological History: Negative for anticoagulant therapy, anemia, bruising or bleeding easily,  hemophilia, sickle cell disease or trait, thrombocytopenia or coagulupathies Endocrine History: Negative for diabetes or thyroid disease Rheumatologic History: Osteoarthritis Musculoskeletal History: Negative for myasthenia gravis, muscular dystrophy, multiple sclerosis or malignant hyperthermia Work History: Legally disabled since 2003.  PFSH  Medical:  Laurie Robles  has a past medical history of Emphysema of lung (HCC); Arthritis; Osteoporosis; Scoliosis; and Bursitis. Family: family history includes COPD in her father and mother; Heart disease in her father. Surgical:  has past surgical history that includes Cervical laminectomy and Wrist surgery (Right). Tobacco:  reports that she has been smoking.  She does not have any smokeless tobacco history on file. Alcohol:  reports that she drinks alcohol. Drug:  reports that she does not use illicit drugs.  Physical Exam  Vitals:  Today's Vitals   09/13/15 1257 09/13/15 1301  BP: 122/97   Pulse: 109   Temp: 97 F (36.1 C)   Resp: 18   Height: 5\' 2"  (1.575 m)   Weight: 108 lb (48.988 kg)   SpO2: 100%   PainSc: 6  6   PainLoc: Foot     Calculated BMI: Body mass index is 19.75 kg/(m^2).     General appearance: alert, appears stated age, cachectic and no distress Eyes: PERLA Respiratory: No evidence respiratory distress, no audible rales or ronchi and no use of accessory muscles of respiration  Cervical Spine Inspection: Evidence of a prior ACDF on the right, primarily done in 2003. Alignment: Symetrical ROM: Adequate Palpation: WNL  Upper Extremities Inspection: No gross anomalies detected ROM: Adequate Sensory: Normal Motor: Unremarkable Pulses: Palpable DTR:  Biceps (C5): WNL Brachioradialis (C6): WNL Triceps (C7): WNL  Thoracic Spine Inspection: No gross anomalies detected Alignment: Symetrical ROM: Adequate Palpation: WNL  Lumbar Spine Inspection: No gross anomalies detected Alignment: Symetrical ROM:  Adequate Palpation: WNL  Gait: Antalgic (limping)  Lower Extremities Inspection: No gross anomalies detected ROM: Adequate Sensory: Normal Motor: Unremarkable  Toe walk (S1): WNL. Difficult with the left foot due to fracture.  Heal walk (L5): WNL. Difficult with left foot due to fracture.   Assessment  Primary Diagnosis & Pertinent Problem List: The primary encounter diagnosis was Chronic pain. Diagnoses of Chronic pain in left foot, Calcaneus fracture, left, sequela, Long term current use of opiate analgesic, Chronic neck pain, Cervical radicular pain, Chronic low back pain, Left foot pain, Chronic shoulder pain (Location of Secondary source of pain) (Bilateral) (R>L), Chronic pain of lower extremity, unspecified laterality, Long term prescription opiate use, Opiate use, Encounter for therapeutic drug level monitoring, Encounter for pain management planning, Pain management, Hx of cervical spine surgery, and Convicted for criminal activity (11/28/1993: Illegal position of schedule II drugs) were also pertinent to this visit.  Visit Diagnosis: 1. Chronic pain   2. Chronic pain in left foot   3. Calcaneus fracture, left, sequela   4. Long term current use of opiate analgesic   5. Chronic neck pain   6. Cervical radicular pain   7. Chronic low back pain   8. Left foot pain   9. Chronic shoulder pain (Location of Secondary source of pain) (Bilateral) (R>L)   10. Chronic pain of lower extremity, unspecified laterality   11. Long term prescription opiate use   12. Opiate use   13. Encounter for therapeutic drug level monitoring   14. Encounter for pain management planning   15. Pain management   16. Hx of cervical spine surgery   17. Convicted for criminal activity (11/28/1993: Illegal position of schedule II drugs)     Assessment: Chronic lower extremity pain (intermittent) (Bilateral) (R>L) Right lower extremity pain: Described as intermittent and occasionally going down to the 2  smallest toes. Left lower extremity pain: Described as intermittent and the patient  was unable to give an exact location except to say that currently it is in the area of the fracture.  Pain management I am somewhat concerned about the fact that she specifically denied having been to other pain clinics, however found through the [facility] Database that she has been seen at the [facility] in [place]. It is also a great concern that even though she was informed that the initial appointment is an evaluation only, she asked several times if we would prescribe pain medicine for her. When she realized that she would not be getting a prescription, she was rather rude to the nursing staff and indicated that she would not be waiting any longer or going to get her blood work today since she needed to go to the emergency room "to get pain medicine". I would consider the above behavior to be suspicious for drug-seeking behavior.    Plan of Care  Note: As per protocol, today's visit has been an evaluation only. We have not taken over the patient's controlled substance management.  Pharmacotherapy (Medications Ordered): No orders of the defined types were placed in this encounter.    Lab-work & Procedure Ordered: Orders Placed This Encounter  Procedures  . MR Cervical Spine Wo Contrast    Standing Status: Future     Number of Occurrences:      Standing Expiration Date: 09/12/2016    Scheduling Instructions:     Please provide canal diameter in millimeters when describing any spinal stenosis.    Order Specific Question:  Reason for Exam (SYMPTOM  OR DIAGNOSIS REQUIRED)    Answer:  Cervical radiculitis/radiculopathy.    Order Specific Question:  Preferred imaging location?    Answer:  [facility]    Order Specific Question:  Does the patient have a pacemaker or implanted devices?    Answer:  No    Order Specific  Question:  What is the patient's sedation requirement?    Answer:  No Sedation    Order Specific Question:  Call Results- Best Contact Number?    Answer:  [phone] (Pain Clinic facility) (Dr. Laban Emperor)  . DG Lumbar Spine Complete W/Bend    Standing Status: Future     Number of Occurrences:      Standing Expiration Date: 09/12/2016    Scheduling Instructions:     Please include flexion and extension views and report any spinal instability (>4 mm displacement of any spondylolisthesis). If present, please report any spondylolisthesis grade, as well as displacement in millimeters.    Order Specific Question:  Reason for Exam (SYMPTOM  OR DIAGNOSIS REQUIRED)    Answer:  Low back pain    Order Specific Question:  Is the patient pregnant?    Answer:  No    Order Specific Question:  Preferred imaging location?    Answer:  [facility]    Order Specific Question:  Call Results- Best Contact Number?    Answer:  [phone] (Pain Clinic facility) (Dr. Laban Emperor)  . Compliance Drug Analysis, Ur    Volume: 30 ml(s). Minimum 3 ml of urine is needed. Document temperature of fresh sample. Indications: Long term (current) use of opiate analgesic (Z79.891) Test#: [id number] (Comprehensive Profile)  . Comprehensive metabolic panel    Standing Status: Future     Number of Occurrences:      Standing Expiration Date: 10/13/2015    Order Specific Question:  Has the patient fasted?    Answer:  No  .  C-reactive protein    Standing Status: Future     Number of Occurrences:      Standing Expiration Date: 10/13/2015  . Magnesium    Standing Status: Future     Number of Occurrences:      Standing Expiration Date: 10/13/2015  . Sedimentation rate    Standing Status: Future     Number of Occurrences:      Standing Expiration Date: 10/13/2015  . Vitamin B12    Standing Status: Future     Number of Occurrences:      Standing Expiration Date: 10/13/2015  . Vitamin D 1,25 dihydroxy    Standing Status:  Future     Number of Occurrences:      Standing Expiration Date: 10/13/2015  . Ambulatory referral to Psychology    Referral Priority:  Routine    Referral Type:  Psychiatric    Referral Reason:  Specialty Services Required    Referred to Provider:  Lance Coon, PHD    Requested Specialty:  Psychology    Number of Visits Requested:  1  . NCV with EMG(electromyography)    Bilateral testing requested.    Standing Status: Future     Number of Occurrences:      Standing Expiration Date: 09/12/2016    Scheduling Instructions:     Please refer this patient to [facility] for Nerve Conduction testing of the lower extremities. Specifically, please evaluate her left foot for possible nerve injury. (EMG & PNCV)    Order Specific Question:  Where should this test be performed?    Answer:  Other    Imaging Ordered: AMB REFERRAL TO PSYCHOLOGY MR CERVICAL SPINE WO CONTRAST DG LUMBAR SPINE COMPLETE W/BEND 6+V  Interventional Therapies: Scheduled: None at this point. PRN Procedures: None at this time.    Referral(s) or Consult(s): Medical psychology consult for substance use disorder evaluation.  Medications administered during this visit: Laurie Robles had no medications administered during this visit.  Prescriptions ordered during this visit: New Prescriptions   No medications on file    Future Appointments Date Time Provider Department Center  09/27/2015 9:00 AM [facility]    Primary Care Physician: No primary care provider on file. Location: [facility] Note by: Trana Ressler A. Laban Emperor, M.D, DABA, DABAPM, DABPM, DABIPP, FIPP

## 2015-09-13 NOTE — Progress Notes (Signed)
Safety precautions to be maintained throughout the outpatient stay will include: orient to surroundings, keep bed in low position, maintain call bell within reach at all times, provide assistance with transfer out of bed and ambulation.  

## 2015-09-13 NOTE — Patient Instructions (Signed)
Smoking Cessation, Tips for Success If you are ready to quit smoking, congratulations! You have chosen to help yourself be healthier. Cigarettes bring nicotine, tar, carbon monoxide, and other irritants into your body. Your lungs, heart, and blood vessels will be able to work better without these poisons. There are many different ways to quit smoking. Nicotine gum, nicotine patches, a nicotine inhaler, or nicotine nasal spray can help with physical craving. Hypnosis, support groups, and medicines help break the habit of smoking. WHAT THINGS CAN I DO TO MAKE QUITTING EASIER?  Here are some tips to help you quit for good:  Pick a date when you will quit smoking completely. Tell all of your friends and family about your plan to quit on that date.  Do not try to slowly cut down on the number of cigarettes you are smoking. Pick a quit date and quit smoking completely starting on that day.  Throw away all cigarettes.   Clean and remove all ashtrays from your home, work, and car.  On a card, write down your reasons for quitting. Carry the card with you and read it when you get the urge to smoke.  Cleanse your body of nicotine. Drink enough water and fluids to keep your urine clear or pale yellow. Do this after quitting to flush the nicotine from your body.  Learn to predict your moods. Do not let a bad situation be your excuse to have a cigarette. Some situations in your life might tempt you into wanting a cigarette.  Never have "just one" cigarette. It leads to wanting another and another. Remind yourself of your decision to quit.  Change habits associated with smoking. If you smoked while driving or when feeling stressed, try other activities to replace smoking. Stand up when drinking your coffee. Brush your teeth after eating. Sit in a different chair when you read the paper. Avoid alcohol while trying to quit, and try to drink fewer caffeinated beverages. Alcohol and caffeine may urge you to  smoke.  Avoid foods and drinks that can trigger a desire to smoke, such as sugary or spicy foods and alcohol.  Ask people who smoke not to smoke around you.  Have something planned to do right after eating or having a cup of coffee. For example, plan to take a walk or exercise.  Try a relaxation exercise to calm you down and decrease your stress. Remember, you may be tense and nervous for the first 2 weeks after you quit, but this will pass.  Find new activities to keep your hands busy. Play with a pen, coin, or rubber band. Doodle or draw things on paper.  Brush your teeth right after eating. This will help cut down on the craving for the taste of tobacco after meals. You can also try mouthwash.   Use oral substitutes in place of cigarettes. Try using lemon drops, carrots, cinnamon sticks, or chewing gum. Keep them handy so they are available when you have the urge to smoke.  When you have the urge to smoke, try deep breathing.  Designate your home as a nonsmoking area.  If you are a heavy smoker, ask your health care provider about a prescription for nicotine chewing gum. It can ease your withdrawal from nicotine.  Reward yourself. Set aside the cigarette money you save and buy yourself something nice.  Look for support from others. Join a support group or smoking cessation program. Ask someone at home or at work to help you with your plan   to quit smoking.  Always ask yourself, "Do I need this cigarette or is this just a reflex?" Tell yourself, "Today, I choose not to smoke," or "I do not want to smoke." You are reminding yourself of your decision to quit.  Do not replace cigarette smoking with electronic cigarettes (commonly called e-cigarettes). The safety of e-cigarettes is unknown, and some may contain harmful chemicals.  If you relapse, do not give up! Plan ahead and think about what you will do the next time you get the urge to smoke. HOW WILL I FEEL WHEN I QUIT SMOKING? You  may have symptoms of withdrawal because your body is used to nicotine (the addictive substance in cigarettes). You may crave cigarettes, be irritable, feel very hungry, cough often, get headaches, or have difficulty concentrating. The withdrawal symptoms are only temporary. They are strongest when you first quit but will go away within 10-14 days. When withdrawal symptoms occur, stay in control. Think about your reasons for quitting. Remind yourself that these are signs that your body is healing and getting used to being without cigarettes. Remember that withdrawal symptoms are easier to treat than the major diseases that smoking can cause.  Even after the withdrawal is over, expect periodic urges to smoke. However, these cravings are generally short lived and will go away whether you smoke or not. Do not smoke! WHAT RESOURCES ARE AVAILABLE TO HELP ME QUIT SMOKING? Your health care provider can direct you to community resources or hospitals for support, which may include:  Group support.  Education.  Hypnosis.  Therapy.   This information is not intended to replace advice given to you by your health care provider. Make sure you discuss any questions you have with your health care provider.   Document Released: 03/08/2004 Document Revised: 07/01/2014 Document Reviewed: 11/26/2012 Elsevier Interactive Patient Education 2016 [employer] Inc[employer]  

## 2015-09-17 DIAGNOSIS — Z5181 Encounter for therapeutic drug level monitoring: Secondary | ICD-10-CM | POA: Insufficient documentation

## 2015-09-17 DIAGNOSIS — G8929 Other chronic pain: Secondary | ICD-10-CM | POA: Insufficient documentation

## 2015-09-17 DIAGNOSIS — R52 Pain, unspecified: Secondary | ICD-10-CM | POA: Insufficient documentation

## 2015-09-17 DIAGNOSIS — Z9889 Other specified postprocedural states: Secondary | ICD-10-CM | POA: Insufficient documentation

## 2015-09-17 DIAGNOSIS — M79672 Pain in left foot: Secondary | ICD-10-CM | POA: Insufficient documentation

## 2015-09-17 DIAGNOSIS — M79606 Pain in leg, unspecified: Secondary | ICD-10-CM

## 2015-09-17 DIAGNOSIS — M25512 Pain in left shoulder: Secondary | ICD-10-CM

## 2015-09-17 DIAGNOSIS — Z0189 Encounter for other specified special examinations: Secondary | ICD-10-CM | POA: Insufficient documentation

## 2015-09-17 DIAGNOSIS — Z650 Conviction in civil and criminal proceedings without imprisonment: Secondary | ICD-10-CM | POA: Insufficient documentation

## 2015-09-17 DIAGNOSIS — Z79891 Long term (current) use of opiate analgesic: Secondary | ICD-10-CM | POA: Insufficient documentation

## 2015-09-17 DIAGNOSIS — M25511 Pain in right shoulder: Secondary | ICD-10-CM

## 2015-09-17 DIAGNOSIS — F1190 Opioid use, unspecified, uncomplicated: Secondary | ICD-10-CM | POA: Insufficient documentation

## 2015-09-17 NOTE — Assessment & Plan Note (Signed)
Right lower extremity pain: Described as intermittent and occasionally going down to the 2 smallest toes. Left lower extremity pain: Described as intermittent and the patient was unable to give an exact location except to say that currently it is in the area of the fracture.

## 2015-09-17 NOTE — Assessment & Plan Note (Signed)
I am somewhat concerned about the fact that she specifically denied having been to other pain clinics, however found through the [facility] Database that she has been seen at the [facility] [place]. It is also a great concern that even though she was informed that the initial appointment is an evaluation only, she asked several times if we would prescribe pain medicine for her. When she realized that she would not be getting a prescription, she was rather rude to the nursing staff and indicated that she would not be waiting any longer or going to get her blood work today since she needed to go to the emergency room "to get pain medicine". I would consider the above behavior to be suspicious for drug-seeking behavior.

## 2015-09-18 LAB — COMPLIANCE DRUG ANALYSIS, UR: PDF: 0

## 2015-09-25 ENCOUNTER — Telehealth: Payer: Self-pay

## 2015-09-25 ENCOUNTER — Other Ambulatory Visit: Payer: Self-pay

## 2015-09-25 MED ORDER — DIAZEPAM 5 MG PO TABS: 5.0000 mg | ORAL_TABLET | ORAL | Status: DC | PRN

## 2015-09-25 NOTE — Telephone Encounter (Signed)
Attempted to call patient.  Left message to call office. 

## 2015-09-25 NOTE — Telephone Encounter (Signed)
Pt wants a script for valium because she has a MRI on 4/5

## 2015-09-25 NOTE — Telephone Encounter (Signed)
[facility]  Patient notified. Patient instructed that she would have to bring a driver to MRI appt.  Patient states understanding .

## 2015-09-25 NOTE — Telephone Encounter (Signed)
Pt needs a script for valium because she is having an MRI on 4/5

## 2015-09-27 ENCOUNTER — Ambulatory Visit: Payer: Medicaid Other

## 2015-09-27 NOTE — Progress Notes (Signed)
Quick Note:  NOTE: This forensic urine drug screen (UDS) test was conducted using a state-of-the-art ultra high performance liquid chromatography and mass spectrometry system (UPLC/MS-MS), the most sophisticated and accurate method available. UPLC/MS-MS is 1,000 times more precise and accurate than standard gas chromatography and mass spectrometry (GC/MS). This system can analyze 26 drug categories and 180 drug compounds.  The results of this test came back with unexpected findings. Unreported oxycodone and Soma found. ______

## 2015-10-16 ENCOUNTER — Ambulatory Visit: Payer: Medicaid Other

## 2015-11-08 ENCOUNTER — Ambulatory Visit: Payer: Medicaid Other

## 2016-03-13 ENCOUNTER — Emergency Department
Admission: EM | Admit: 2016-03-13 | Discharge: 2016-03-13 | Disposition: A | Discharge status: Home / Self Care | Payer: Medicaid Other | Attending: Emergency Medicine | Admitting: Emergency Medicine

## 2016-03-13 DIAGNOSIS — R220 Localized swelling, mass and lump, head: Secondary | ICD-10-CM

## 2016-03-13 DIAGNOSIS — F17200 Nicotine dependence, unspecified, uncomplicated: Secondary | ICD-10-CM | POA: Diagnosis not present

## 2016-03-13 DIAGNOSIS — L03211 Cellulitis of face: Secondary | ICD-10-CM | POA: Insufficient documentation

## 2016-03-13 DIAGNOSIS — H6993 Unspecified Eustachian tube disorder, bilateral: Secondary | ICD-10-CM | POA: Insufficient documentation

## 2016-03-13 MED ORDER — TRAMADOL HCL 50 MG PO TABS: 50.0000 mg | ORAL_TABLET | Freq: Four times a day (QID) | ORAL | 0 refills | Status: DC | PRN

## 2016-03-13 MED ORDER — HYDROXYZINE HCL 50 MG PO TABS: 50.0000 mg | ORAL_TABLET | Freq: Three times a day (TID) | ORAL | 0 refills | Status: DC | PRN

## 2016-03-13 MED ORDER — HYDROXYZINE HCL 50 MG PO TABS
50.0000 mg | ORAL_TABLET | Freq: Once | ORAL | Status: AC
  Administered 2016-03-13: 50 mg via ORAL
  Filled 2016-03-13: qty 1

## 2016-03-13 MED ORDER — METHYLPREDNISOLONE 4 MG PO TBPK: ORAL_TABLET | ORAL | 0 refills | Status: DC

## 2016-03-13 MED ORDER — SULFAMETHOXAZOLE-TRIMETHOPRIM 800-160 MG PO TABS: 1.0000 | ORAL_TABLET | Freq: Two times a day (BID) | ORAL | 0 refills | Status: DC

## 2016-03-13 MED ORDER — PREDNISONE 20 MG PO TABS
60.0000 mg | ORAL_TABLET | Freq: Once | ORAL | Status: AC
  Administered 2016-03-13: 60 mg via ORAL
  Filled 2016-03-13: qty 3

## 2016-03-13 MED ORDER — TRAMADOL HCL 50 MG PO TABS
50.0000 mg | ORAL_TABLET | Freq: Once | ORAL | Status: AC
  Administered 2016-03-13: 50 mg via ORAL
  Filled 2016-03-13: qty 1

## 2016-03-13 NOTE — ED Triage Notes (Signed)
Pt reports of swelling to right side of face for past 2 days after inhaling Carpet fresh with Oxyclean. Denies any difficulty swallowing or breathing.

## 2016-03-13 NOTE — ED Provider Notes (Signed)
[facility] Emergency Department Provider Note   ____________________________________________   First MD Initiated Contact with Patient 03/13/16 1413     (approximate)  I have reviewed the triage vital signs and the nursing notes.   HISTORY  Chief Complaint Allergic Reaction and Facial Swelling    HPI Laurie Robles is a 55 y.o. female is complaining of right side spatial swelling for 2 days. Patient stated this occurred after inhaling carpet fresh see OxyClean. Patient denies any breathing difficulties. Patient states this happened in the past she used this product. Patient also complaining of bilateral ear pain. Patient denies any fever or chills associated this complaint. Patient rates her pain discomfort as a 6/10. No palliative measures for his complaint. Past Medical History:  Diagnosis Date  . Arthritis   . Bursitis   . Emphysema of lung (HCC)   . Osteoporosis   . Scoliosis     Patient Active Problem List   Diagnosis Date Noted  . Foot pain (Left) 09/17/2015  . Chronic shoulder pain (Location of Secondary source of pain) (Bilateral) (R>L) 09/17/2015  . Chronic lower extremity pain (intermittent) (Bilateral) (R>L) 09/17/2015  . Long term prescription opiate use 09/17/2015  . Opiate use 09/17/2015  . Encounter for therapeutic drug level monitoring 09/17/2015  . Encounter for pain management planning 09/17/2015  . Pain management 09/17/2015  . Hx of cervical spine surgery 09/17/2015  . Convicted for criminal activity (11/28/1993: Illegal position of schedule II drugs) 09/17/2015  . Chronic pain 09/13/2015  . Chronic foot pain (Location of Primary Source of Pain) (Left) 09/13/2015  . Healed Calcaneus fracture (Left) 09/13/2015  . Long term current use of opiate analgesic 09/13/2015  . Chronic neck pain (Location of Tertiary source of pain) (posterior) (Bilateral) (R>L) 09/13/2015  . Cervical radicular pain 09/13/2015  . Chronic low back  pain (Bilateral) (R>L) 09/13/2015    Past Surgical History:  Procedure Laterality Date  . CERVICAL LAMINECTOMY    . WRIST SURGERY Right    x 2    Prior to Admission medications   Medication Sig Start Date End Date Taking? Authorizing Provider  ALPRAZolam Prudy Feeler) 1 MG tablet Take 1 mg by mouth 4 (four) times daily.    Historical Provider, MD  Amphetamine-Dextroamphetamine (AMPHETAMINE SALT COMBO PO) Take 20 mg by mouth 3 (three) times daily.    Historical Provider, MD  cetirizine (ZYRTEC) 10 MG tablet Take 10 mg by mouth daily.    Historical Provider, MD  diazepam (VALIUM) 5 MG tablet Take 1 tablet (5 mg total) by mouth as needed. Take 1 tablet 30 minutes prior to MRI and take 1 tablet immediately upon entering MRI if needed. 09/25/15   Delano Metz, MD  estrogen, conjugated,-medroxyprogesterone (PREMPRO) 0.625-2.5 MG per tablet Take 1 tablet by mouth every evening.    Historical Provider, MD  hydrOXYzine (ATARAX/VISTARIL) 50 MG tablet Take 1 tablet (50 mg total) by mouth 3 (three) times daily as needed for itching. 03/13/16   Joni Reining, PA-C  ibuprofen (ADVIL,MOTRIN) 800 MG tablet Take 1 tablet (800 mg total) by mouth every 8 (eight) hours as needed for moderate pain. 03/08/15   Joni Reining, PA-C  methylPREDNISolone (MEDROL DOSEPAK) 4 MG TBPK tablet Take Tapered dose as directed 03/13/16   Joni Reining, PA-C  Olopatadine HCl (PATADAY) 0.2 % SOLN Apply 1 drop to eye 2 (two) times daily.    Historical Provider, MD  omeprazole (PRILOSEC) 20 MG capsule Take 20 mg by mouth daily.  Historical Provider, MD  sulfamethoxazole-trimethoprim (BACTRIM DS,SEPTRA DS) 800-160 MG tablet Take 1 tablet by mouth 2 (two) times daily. 03/13/16   Joni Reining, PA-C  traMADol (ULTRAM) 50 MG tablet Take 1 tablet (50 mg total) by mouth every 6 (six) hours as needed for moderate pain. 03/13/16   Joni Reining, PA-C    Allergies   Family History  Problem Relation Age of Onset  . COPD Mother   . COPD  Father   . Heart disease Father     Social History Social History  Substance Use Topics  . Smoking status: Current Every Day Smoker  . Smokeless tobacco: Not on file  . Alcohol use 0.0 oz/week     Comment: occasionally    Review of Systems Constitutional: No fever/chills Eyes: No visual changes. ENT: No sore throat. Cardiovascular: Denies chest pain. Respiratory: Denies shortness of breath. Gastrointestinal: No abdominal pain.  No nausea, no vomiting.  No diarrhea.  No constipation. Genitourinary: Negative for dysuria. Musculoskeletal: Negative for back pain. Skin: Negative for rash. Right facial swelling and redness.  Neurological: Negative for headaches, focal weakness or numbness.  Allergic/Immunilogical: **} 10-point ROS otherwise negative.  ____________________________________________   PHYSICAL EXAM:  VITAL SIGNS: ED Triage Vitals  Enc Vitals Group     BP 03/13/16 1340 117/83     Pulse Rate 03/13/16 1340 (!) 104     Resp 03/13/16 1340 20     Temp 03/13/16 1340 97.6 F (36.4 C)     Temp Source 03/13/16 1340 Oral     SpO2 03/13/16 1340 100 %     Weight 03/13/16 1341 109 lb (49.4 kg)     Height 03/13/16 1341 5\' 3"  (1.6 m)     Head Circumference --      Peak Flow --      Pain Score 03/13/16 1344 6     Pain Loc --      Pain Edu? --      Excl. in GC? --     Constitutional: Alert and oriented. Well appearing and in no acute distress. Eyes: Conjunctivae are normal. PERRL. EOMI. Head: Atraumatic. Nose: No congestion/rhinnorhea. EARS: Bilateral edematous TMs Mouth/Throat: Mucous membranes are moist.  Oropharynx non-erythematous. Neck: No stridor.  No cervical spine tenderness to palpation. Hematological/Lymphatic/Immunilogical: No cervical lymphadenopathy. Cardiovascular: Normal rate, regular rhythm. Grossly normal heart sounds.  Good peripheral circulation. Respiratory: Normal respiratory effort.  No retractions. Lungs CTAB. Gastrointestinal: Soft and  nontender. No distention. No abdominal bruits. No CVA tenderness. Musculoskeletal: No lower extremity tenderness nor edema.  No joint effusions. Neurologic:  Normal speech and language. No gross focal neurologic deficits are appreciated. No gait instability. Skin:  Right lateral facial edema and erythema. Psychiatric: Mood and affect are normal. Speech and behavior are normal.  ____________________________________________   LABS (all labs ordered are listed, but only abnormal results are displayed)  Labs Reviewed - No data to display ____________________________________________  EKG   ____________________________________________  RADIOLOGY   ____________________________________________   PROCEDURES  Procedure(s) performed:   Procedures  Critical Care performed: No  ____________________________________________   INITIAL IMPRESSION / ASSESSMENT AND PLAN / ED COURSE  Pertinent labs & imaging results that were available during my care of the patient were reviewed by me and considered in my medical decision making (see chart for details).  Facial cellulitis Bilateral eustachian tube dysfunction.  Clinical Course     ____________________________________________   FINAL CLINICAL IMPRESSION(S) / ED DIAGNOSES  Final diagnoses:  Facial swelling  Facial cellulitis  Eustachian tube disorder, bilateral      NEW MEDICATIONS STARTED DURING THIS VISIT:  New Prescriptions   HYDROXYZINE (ATARAX/VISTARIL) 50 MG TABLET    Take 1 tablet (50 mg total) by mouth 3 (three) times daily as needed for itching.   METHYLPREDNISOLONE (MEDROL DOSEPAK) 4 MG TBPK TABLET    Take Tapered dose as directed   SULFAMETHOXAZOLE-TRIMETHOPRIM (BACTRIM DS,SEPTRA DS) 800-160 MG TABLET    Take 1 tablet by mouth 2 (two) times daily.   TRAMADOL (ULTRAM) 50 MG TABLET    Take 1 tablet (50 mg total) by mouth every 6 (six) hours as needed for moderate pain.     Note:  This document was prepared  using Dragon voice recognition software and may include unintentional dictation errors.    Joni Reiningonald K Wynonia Medero, PA-C 03/13/16 1434    Jeanmarie PlantJames A McShane, MD 03/13/16 [phone]

## 2016-03-13 NOTE — ED Notes (Signed)
States right sided facial swelling after using carpet cleaner, pt asking for pain medicine, pt awake and alert in no acute distress

## 2016-03-13 NOTE — Discharge Instructions (Signed)
Take medication as directed.

## 2016-07-09 ENCOUNTER — Emergency Department
Admission: EM | Admit: 2016-07-09 | Discharge: 2016-07-09 | Disposition: A | Discharge status: Home / Self Care | Payer: Medicaid Other | Attending: Emergency Medicine | Admitting: Emergency Medicine

## 2016-07-09 ENCOUNTER — Emergency Department: Payer: Medicaid Other

## 2016-07-09 ENCOUNTER — Encounter: Payer: Self-pay | Admitting: Emergency Medicine

## 2016-07-09 DIAGNOSIS — L03114 Cellulitis of left upper limb: Secondary | ICD-10-CM | POA: Insufficient documentation

## 2016-07-09 DIAGNOSIS — F17200 Nicotine dependence, unspecified, uncomplicated: Secondary | ICD-10-CM | POA: Diagnosis not present

## 2016-07-09 DIAGNOSIS — M25532 Pain in left wrist: Secondary | ICD-10-CM | POA: Diagnosis present

## 2016-07-09 DIAGNOSIS — M19032 Primary osteoarthritis, left wrist: Secondary | ICD-10-CM

## 2016-07-09 MED ORDER — OXYCODONE-ACETAMINOPHEN 5-325 MG PO TABS
1.0000 | ORAL_TABLET | Freq: Once | ORAL | Status: AC
  Administered 2016-07-09: 1 via ORAL
  Filled 2016-07-09: qty 1

## 2016-07-09 MED ORDER — PREDNISONE 5 MG PO TABS: 30.0000 mg | ORAL_TABLET | Freq: Every day | ORAL | 0 refills | Status: DC

## 2016-07-09 MED ORDER — CLINDAMYCIN HCL 300 MG PO CAPS: 300.0000 mg | ORAL_CAPSULE | Freq: Three times a day (TID) | ORAL | 0 refills | Status: AC

## 2016-07-09 NOTE — ED Provider Notes (Signed)
[facility] Emergency Department Provider Note  ____________________________________________  Time seen: Approximately 1:42 PM  I have reviewed the triage vital signs and the nursing notes.   HISTORY  Chief Complaint Wrist Pain    HPI Laurie Robles is a 56 y.o. female , NAD, presents to the emergency department with 1 day history of left wrist pain, redness and swelling. Patient states she has been moving and sorting boxes over the last couple of days. Noted pain about her left wrist last night but is significantly increased as of today. Also notes redness and swelling to the area. Denies any specific injury or trauma to the area. Has not noted any skin sores, open wounds, no oozing, weeping or bleeding. Denies fevers, chills or body aches. States the pain is sharp and shooting at times. Has a significant history of arthritis but states this pain is different. States she is no longer in pain management and is no longer using any pain medications.   Past Medical History:  Diagnosis Date  . Arthritis   . Bursitis   . Emphysema of lung (HCC)   . Osteoporosis   . Scoliosis     Patient Active Problem List   Diagnosis Date Noted  . Foot pain (Left) 09/17/2015  . Chronic shoulder pain (Location of Secondary source of pain) (Bilateral) (R>L) 09/17/2015  . Chronic lower extremity pain (intermittent) (Bilateral) (R>L) 09/17/2015  . Long term prescription opiate use 09/17/2015  . Opiate use 09/17/2015  . Encounter for therapeutic drug level monitoring 09/17/2015  . Encounter for pain management planning 09/17/2015  . Pain management 09/17/2015  . Hx of cervical spine surgery 09/17/2015  . Convicted for criminal activity (11/28/1993: Illegal position of schedule II drugs) 09/17/2015  . Chronic pain 09/13/2015  . Chronic foot pain (Location of Primary Source of Pain) (Left) 09/13/2015  . Healed Calcaneus fracture (Left) 09/13/2015  . Long term current use of opiate  analgesic 09/13/2015  . Chronic neck pain (Location of Tertiary source of pain) (posterior) (Bilateral) (R>L) 09/13/2015  . Cervical radicular pain 09/13/2015  . Chronic low back pain (Bilateral) (R>L) 09/13/2015    Past Surgical History:  Procedure Laterality Date  . CERVICAL LAMINECTOMY    . WRIST SURGERY Right    x 2    Prior to Admission medications   Medication Sig Start Date End Date Taking? Authorizing Provider  ALPRAZolam Prudy Feeler) 1 MG tablet Take 1 mg by mouth 4 (four) times daily.    Historical Provider, MD  Amphetamine-Dextroamphetamine (AMPHETAMINE SALT COMBO PO) Take 20 mg by mouth 3 (three) times daily.    Historical Provider, MD  cetirizine (ZYRTEC) 10 MG tablet Take 10 mg by mouth daily.    Historical Provider, MD  clindamycin (CLEOCIN) 300 MG capsule Take 1 capsule (300 mg total) by mouth 3 (three) times daily. 07/09/16 07/16/16  Madylin Fairbank L Kert Shackett, PA-C  estrogen, conjugated,-medroxyprogesterone (PREMPRO) 0.625-2.5 MG per tablet Take 1 tablet by mouth every evening.    Historical Provider, MD  ibuprofen (ADVIL,MOTRIN) 800 MG tablet Take 1 tablet (800 mg total) by mouth every 8 (eight) hours as needed for moderate pain. 03/08/15   Joni Reining, PA-C  Olopatadine HCl (PATADAY) 0.2 % SOLN Apply 1 drop to eye 2 (two) times daily.    Historical Provider, MD  omeprazole (PRILOSEC) 20 MG capsule Take 20 mg by mouth daily.    Historical Provider, MD  predniSONE (DELTASONE) 5 MG tablet Take 6 tablets (30 mg total) by mouth daily with  breakfast. May take for up to 5 days.  Do not take any NSAIDs with this medication. Take with food. 07/09/16   Mardie Kellen L Darian Ace, PA-C    Allergies Dilaudid [hydromorphone hcl]; Hydromorphone; Erythromycin; and Penicillins  Family History  Problem Relation Age of Onset  . COPD Mother   . COPD Father   . Heart disease Father     Social History Social History  Substance Use Topics  . Smoking status: Current Every Day Smoker  . Smokeless tobacco: Never  Used  . Alcohol use 0.0 oz/week     Comment: occasionally     Review of Systems  Constitutional: No fever/chills Musculoskeletal: Positive left wrist pain. Negative left hand, fingers, elbow pain.  Skin: Positive redness, swelling left wrist. Negative for rash. Neurological: Negative for numbness, weakness, tingling. 10-point ROS otherwise negative.  ____________________________________________   PHYSICAL EXAM:  VITAL SIGNS: ED Triage Vitals  Enc Vitals Group     BP 07/09/16 1319 137/87     Pulse Rate 07/09/16 1319 (!) 102     Resp 07/09/16 1319 18     Temp 07/09/16 1319 98 F (36.7 C)     Temp Source 07/09/16 1319 Oral     SpO2 07/09/16 1319 97 %     Weight 07/09/16 1320 107 lb (48.5 kg)     Height 07/09/16 1320 5\' 2"  (1.575 m)     Head Circumference --      Peak Flow --      Pain Score 07/09/16 1331 10     Pain Loc --      Pain Edu? --      Excl. in GC? --      Constitutional: Alert and oriented. Well appearing and in no acute distress. Eyes: Conjunctivae are normal.  Head: Atraumatic. Cardiovascular: Good peripheral circulationWith 2+ pulses noted in the left upper extremity. Capillary refill is brisk in all digits of left hand. Respiratory: Normal respiratory effort without tachypnea or retractions.  Musculoskeletal: Full range of motion of the left shoulder, upper arm, elbow, forearm, fingers without pain or difficulty. Significant pain with attempts at flexion and extension of the left wrist without crepitus or bony abnormality. Range of motion is decreased with flexion and extension of left wrist due to pain. Neurologic:  Normal speech and language. No gross focal neurologic deficits are appreciated. Sensation to light touch grossly intact about the left wrist and upper extremity. Skin:  Skin about the medial left wrist with erythema and swelling but no skin sores or open wounds. No induration or fluctuance. No streaking. Skin is warm, dry and intact. No rash  noted. Psychiatric: Mood and affect are normal. Speech and behavior are normal. Patient exhibits appropriate insight and judgement.   ____________________________________________   LABS  None ____________________________________________  EKG  None ____________________________________________  RADIOLOGY I, Hope PigeonJami L Keyosha Tiedt, personally viewed and evaluated these images (plain radiographs) as part of my medical decision making, as well as reviewing the written report by the radiologist.  Dg Wrist Complete Left  Result Date: 07/09/2016 CLINICAL DATA:  Pain in left wrist medially with erythema and swelling since yesterday. EXAM: LEFT WRIST - COMPLETE 3+ VIEW COMPARISON:  None. FINDINGS: Suspected faint chondrocalcinosis along the distal margin of the ulnar styloid. Approximately 2 mm of negative ulnar variance. Mild spurring at the first carpometacarpal articulation. Pronator fat pad normal.  No other abnormality identified. IMPRESSION: 1. Faint calcific density projecting in the ulnotriquetral region, possibly from mild chondrocalcinosis. This can sometimes be seen in  CPPD arthropathy. 2. Mild degenerative findings at the first carpometacarpal articulation. Electronically Signed   By: Gaylyn Rong M.D.   On: 07/09/2016 14:11    ____________________________________________    PROCEDURES  Procedure(s) performed: None   Procedures   Medications  oxyCODONE-acetaminophen (PERCOCET/ROXICET) 5-325 MG per tablet 1 tablet (1 tablet Oral Given 07/09/16 1441)     ____________________________________________   INITIAL IMPRESSION / ASSESSMENT AND PLAN / ED COURSE  Pertinent labs & imaging results that were available during my care of the patient were reviewed by me and considered in my medical decision making (see chart for details).  Clinical Course as of Jul 09 1445  Tue Jul 09, 2016  1422 [place] controlled substance database was accessed and reviewed. The patient is  receiving monthly prescriptions for Adderall and alprazolam from Dr. Lynett Fish. Soma, Diazepam and Ultram received in the last year but not in the last 6 months.  [JH]    Clinical Course User Index [JH] Trajan Grove L Navraj Dreibelbis, PA-C    Patient's diagnosis is consistent with Arthritis and cellulitis of the left wrist. Patient was given one tablet of Percocet while in the emergency department. Discussed with patient that narcotic pain medications would not be prescribed at this time due to the patient's use of long-term narcotics and misuse in the past. Patient will be discharged home with prescriptions for prednisone and clindamycin to take as directed. Patient is to follow up with her primary care provider or Dr. Joice Lofts in orthopedics if symptoms persist past this treatment course. Patient is given ED precautions to return to the ED for any worsening or new symptoms.    ____________________________________________  FINAL CLINICAL IMPRESSION(S) / ED DIAGNOSES  Final diagnoses:  Arthritis of left wrist  Cellulitis of left upper extremity      NEW MEDICATIONS STARTED DURING THIS VISIT:  New Prescriptions   CLINDAMYCIN (CLEOCIN) 300 MG CAPSULE    Take 1 capsule (300 mg total) by mouth 3 (three) times daily.   PREDNISONE (DELTASONE) 5 MG TABLET    Take 6 tablets (30 mg total) by mouth daily with breakfast. May take for up to 5 days.  Do not take any NSAIDs with this medication. Take with food.         Hope Pigeon, PA-C 07/09/16 1449    Jennye Moccasin, MD 07/09/16 [phone]

## 2016-07-09 NOTE — ED Triage Notes (Signed)
Presents with pain to left wrist area for 2 days denies any specific injury  But has been going thur storage bins at home and felt pain   Redness noted with min swelling

## 2016-08-02 ENCOUNTER — Emergency Department
Admission: EM | Admit: 2016-08-02 | Discharge: 2016-08-02 | Disposition: A | Discharge status: Home / Self Care | Payer: Medicaid Other | Attending: Emergency Medicine | Admitting: Emergency Medicine

## 2016-08-02 DIAGNOSIS — F17200 Nicotine dependence, unspecified, uncomplicated: Secondary | ICD-10-CM | POA: Insufficient documentation

## 2016-08-02 DIAGNOSIS — M545 Low back pain: Secondary | ICD-10-CM | POA: Diagnosis present

## 2016-08-02 DIAGNOSIS — M5416 Radiculopathy, lumbar region: Secondary | ICD-10-CM | POA: Diagnosis not present

## 2016-08-02 DIAGNOSIS — Z79899 Other long term (current) drug therapy: Secondary | ICD-10-CM | POA: Insufficient documentation

## 2016-08-02 DIAGNOSIS — Z791 Long term (current) use of non-steroidal anti-inflammatories (NSAID): Secondary | ICD-10-CM | POA: Diagnosis not present

## 2016-08-02 DIAGNOSIS — M5410 Radiculopathy, site unspecified: Secondary | ICD-10-CM

## 2016-08-02 DIAGNOSIS — M5412 Radiculopathy, cervical region: Secondary | ICD-10-CM | POA: Insufficient documentation

## 2016-08-02 MED ORDER — METHYLPREDNISOLONE 4 MG PO TBPK: ORAL_TABLET | ORAL | 0 refills | Status: DC

## 2016-08-02 MED ORDER — CARISOPRODOL 350 MG PO TABS: 350.0000 mg | ORAL_TABLET | Freq: Three times a day (TID) | ORAL | 0 refills | Status: AC | PRN

## 2016-08-02 NOTE — ED Notes (Signed)
AAOx3.  Skin warm and dry.  NAD 

## 2016-08-02 NOTE — ED Triage Notes (Signed)
Pt arrives to ER via POV c/o back pain and burning arm pain starting at bilateral shoulders and down to hands. Burning in nature. Pt alert and oriented X4, active, cooperative, pt in NAD. RR even and unlabored, color WNL.

## 2016-08-02 NOTE — ED Provider Notes (Signed)
[facility] Emergency Department Provider Note   ____________________________________________   None    (approximate)  I have reviewed the triage vital signs and the nursing notes.   HISTORY  Chief Complaint Arm Pain and Back Pain    HPI Laurie Robles is a 56 y.o. female patient complain of burning pain radiating from her neck to bilateral upper extremities. Patient stated complaint  persists greater than 2 months. Patient state her PCP to schedule her for cervical MRI but aren't sure states she needs referral from a neurologist. Patient stated mild transient relief with steroids. Patient state her PCP offered her steroidal injection followed by a Medrol Dosepak but she has a full nuchal needles and refuses to take injections. Patient come to the ED for pain relief. Patient rates the pain as 8/10.  Past Medical History:  Diagnosis Date  . Arthritis   . Bursitis   . Emphysema of lung (HCC)   . Osteoporosis   . Scoliosis     Patient Active Problem List   Diagnosis Date Noted  . Foot pain (Left) 09/17/2015  . Chronic shoulder pain (Location of Secondary source of pain) (Bilateral) (R>L) 09/17/2015  . Chronic lower extremity pain (intermittent) (Bilateral) (R>L) 09/17/2015  . Long term prescription opiate use 09/17/2015  . Opiate use 09/17/2015  . Encounter for therapeutic drug level monitoring 09/17/2015  . Encounter for pain management planning 09/17/2015  . Pain management 09/17/2015  . Hx of cervical spine surgery 09/17/2015  . Convicted for criminal activity (11/28/1993: Illegal position of schedule II drugs) 09/17/2015  . Chronic pain 09/13/2015  . Chronic foot pain (Location of Primary Source of Pain) (Left) 09/13/2015  . Healed Calcaneus fracture (Left) 09/13/2015  . Long term current use of opiate analgesic 09/13/2015  . Chronic neck pain (Location of Tertiary source of pain) (posterior) (Bilateral) (R>L) 09/13/2015  . Cervical radicular pain  09/13/2015  . Chronic low back pain (Bilateral) (R>L) 09/13/2015    Past Surgical History:  Procedure Laterality Date  . CERVICAL LAMINECTOMY    . WRIST SURGERY Right    x 2    Prior to Admission medications   Medication Sig Start Date End Date Taking? Authorizing Provider  ALPRAZolam Prudy Feeler) 1 MG tablet Take 1 mg by mouth 4 (four) times daily.    Historical Provider, MD  Amphetamine-Dextroamphetamine (AMPHETAMINE SALT COMBO PO) Take 20 mg by mouth 3 (three) times daily.    Historical Provider, MD  carisoprodol (SOMA) 350 MG tablet Take 1 tablet (350 mg total) by mouth 3 (three) times daily as needed for muscle spasms. 08/02/16 08/02/17  Joni Reining, PA-C  cetirizine (ZYRTEC) 10 MG tablet Take 10 mg by mouth daily.    Historical Provider, MD  estrogen, conjugated,-medroxyprogesterone (PREMPRO) 0.625-2.5 MG per tablet Take 1 tablet by mouth every evening.    Historical Provider, MD  ibuprofen (ADVIL,MOTRIN) 800 MG tablet Take 1 tablet (800 mg total) by mouth every 8 (eight) hours as needed for moderate pain. 03/08/15   Joni Reining, PA-C  methylPREDNISolone (MEDROL DOSEPAK) 4 MG TBPK tablet Take Tapered dose as directed 08/02/16   Joni Reining, PA-C  Olopatadine HCl (PATADAY) 0.2 % SOLN Apply 1 drop to eye 2 (two) times daily.    Historical Provider, MD  omeprazole (PRILOSEC) 20 MG capsule Take 20 mg by mouth daily.    Historical Provider, MD  predniSONE (DELTASONE) 5 MG tablet Take 6 tablets (30 mg total) by mouth daily with breakfast. May take  for up to 5 days.  Do not take any NSAIDs with this medication. Take with food. 07/09/16   Jami L Hagler, PA-C    Allergies Erythromycin and Penicillins  Family History  Problem Relation Age of Onset  . COPD Mother   . COPD Father   . Heart disease Father     Social History Social History  Substance Use Topics  . Smoking status: Current Every Day Smoker  . Smokeless tobacco: Never Used  . Alcohol use 0.0 oz/week     Comment:  occasionally    Review of Systems Constitutional: No fever/chills Eyes: No visual changes. ENT: No sore throat. Cardiovascular: Denies chest pain. Respiratory: Denies shortness of breath. Gastrointestinal: No abdominal pain.  No nausea, no vomiting.  No diarrhea.  No constipation. Genitourinary: Negative for dysuria. Musculoskeletal: Chronic neck and back pain Skin: Negative for rash. Neurological: Negative for headaches, focal weakness or numbness. Psychiatric:Long-term opiate use Allergic/Immunilogical: See medication list  ____________________________________________   PHYSICAL EXAM:  VITAL SIGNS: ED Triage Vitals [08/02/16 1644]  Enc Vitals Group     BP (!) 103/59     Pulse Rate (!) 102     Resp 20     Temp 98.1 F (36.7 C)     Temp Source Oral     SpO2 98 %     Weight 105 lb (47.6 kg)     Height 5\' 2"  (1.575 m)     Head Circumference      Peak Flow      Pain Score 8     Pain Loc      Pain Edu?      Excl. in GC?     Constitutional: Alert and oriented. Well appearing and in no acute distress. Eyes: Conjunctivae are normal. PERRL. EOMI. Head: Atraumatic. Nose: No congestion/rhinnorhea. Mouth/Throat: Mucous membranes are moist.  Oropharynx non-erythematous. Neck: No stridor.  No cervical spine tenderness to palpation.Surgical scars consistent with history of laminectomy. Hematological/Lymphatic/Immunilogical: No cervical lymphadenopathy. Cardiovascular: Normal rate, regular rhythm. Grossly normal heart sounds.  Good peripheral circulation. Respiratory: Normal respiratory effort.  No retractions. Lungs CTAB. Gastrointestinal: Soft and nontender. No distention. No abdominal bruits. No CVA tenderness. Musculoskeletal: Diffuse guarding along cervical and lumbar spinal processes. Neurologic:  Normal speech and language. No gross focal neurologic deficits are appreciated. No gait instability. Skin:  Skin is warm, dry and intact. No rash noted. Psychiatric: Mood and  affect are normal. Speech and behavior are normal.  ____________________________________________   LABS (all labs ordered are listed, but only abnormal results are displayed)  Labs Reviewed - No data to display ____________________________________________  EKG   ____________________________________________  RADIOLOGY   ____________________________________________   PROCEDURES  Procedure(s) performed: None  Procedures  Critical Care performed: No  ____________________________________________   INITIAL IMPRESSION / ASSESSMENT AND PLAN / ED COURSE  Pertinent labs & imaging results that were available during my care of the patient were reviewed by me and considered in my medical decision making (see chart for details).  Radicular pain. Discussed with patient rationale for follow-up with neurologist for definitive evaluation and treatment. Patient given prescriptions for Medrol Dosepak and Soma.      ____________________________________________   FINAL CLINICAL IMPRESSION(S) / ED DIAGNOSES  Final diagnoses:  Radicular pain      NEW MEDICATIONS STARTED DURING THIS VISIT:  Discharge Medication List as of 08/02/2016  5:25 PM    START taking these medications   Details  carisoprodol (SOMA) 350 MG tablet Take 1 tablet (350 mg  total) by mouth 3 (three) times daily as needed for muscle spasms., Starting Fri 08/02/2016, Until Sat 08/02/2017, Print    methylPREDNISolone (MEDROL DOSEPAK) 4 MG TBPK tablet Take Tapered dose as directed, Print         Note:  This document was prepared using Dragon voice recognition software and may include unintentional dictation errors.    Joni Reining, PA-C 08/02/16 1738    Jeanmarie Plant, MD 08/02/16 2109

## 2017-04-27 ENCOUNTER — Emergency Department
Admission: EM | Admit: 2017-04-27 | Discharge: 2017-04-27 | Disposition: A | Discharge status: Home / Self Care | Payer: Medicaid Other | Attending: Emergency Medicine | Admitting: Emergency Medicine

## 2017-04-27 ENCOUNTER — Encounter: Payer: Self-pay | Admitting: Emergency Medicine

## 2017-04-27 DIAGNOSIS — G8929 Other chronic pain: Secondary | ICD-10-CM | POA: Insufficient documentation

## 2017-04-27 DIAGNOSIS — M542 Cervicalgia: Secondary | ICD-10-CM | POA: Diagnosis present

## 2017-04-27 DIAGNOSIS — Z79899 Other long term (current) drug therapy: Secondary | ICD-10-CM | POA: Diagnosis not present

## 2017-04-27 DIAGNOSIS — F17200 Nicotine dependence, unspecified, uncomplicated: Secondary | ICD-10-CM | POA: Diagnosis not present

## 2017-04-27 MED ORDER — DICLOFENAC SODIUM 50 MG PO TBEC: 50.0000 mg | DELAYED_RELEASE_TABLET | Freq: Two times a day (BID) | ORAL | 1 refills | Status: DC

## 2017-04-27 MED ORDER — CYCLOBENZAPRINE HCL 5 MG PO TABS: 5.0000 mg | ORAL_TABLET | Freq: Three times a day (TID) | ORAL | 0 refills | Status: AC | PRN

## 2017-04-27 NOTE — ED Provider Notes (Signed)
[facility] Emergency Department Provider Note ____________________________________________  Time seen: 2055  I have reviewed the triage vital signs and the nursing notes.  HISTORY  Chief Complaint  Neck Pain  HPI Samanth Mirkin is a 56 y.o. female presents to the ED for evaluation and management of acute on chronic pain. She denies any recent injury, accident, or trauma. She reports pain to her neck, shoulders, and hands. She has OA, DDD, chronic pain, and fibromyalgia. She is a patient of Dr. Lacie Scotts and takes daily alprazolam and Adderall. She denies any chest pain, incontinence, or distal paresthesias above baseline. She is requesting pain medicines.   Past Medical History:  Diagnosis Date  . Arthritis   . Bursitis   . Emphysema of lung (HCC)   . Osteoporosis   . Scoliosis     Patient Active Problem List   Diagnosis Date Noted  . Foot pain (Left) 09/17/2015  . Chronic shoulder pain (Location of Secondary source of pain) (Bilateral) (R>L) 09/17/2015  . Chronic lower extremity pain (intermittent) (Bilateral) (R>L) 09/17/2015  . Long term prescription opiate use 09/17/2015  . Opiate use 09/17/2015  . Encounter for therapeutic drug level monitoring 09/17/2015  . Encounter for pain management planning 09/17/2015  . Pain management 09/17/2015  . Hx of cervical spine surgery 09/17/2015  . Convicted for criminal activity (11/28/1993: Illegal position of schedule II drugs) 09/17/2015  . Chronic pain 09/13/2015  . Chronic foot pain (Location of Primary Source of Pain) (Left) 09/13/2015  . Healed Calcaneus fracture (Left) 09/13/2015  . Long term current use of opiate analgesic 09/13/2015  . Chronic neck pain (Location of Tertiary source of pain) (posterior) (Bilateral) (R>L) 09/13/2015  . Cervical radicular pain 09/13/2015  . Chronic low back pain (Bilateral) (R>L) 09/13/2015    Past Surgical History:  Procedure Laterality Date  . CERVICAL LAMINECTOMY    .  WRIST SURGERY Right    x 2    Prior to Admission medications   Medication Sig Start Date End Date Taking? Authorizing Provider  ALPRAZolam Prudy Feeler) 1 MG tablet Take 1 mg by mouth 4 (four) times daily.    [provider]  Amphetamine-Dextroamphetamine (AMPHETAMINE SALT COMBO PO) Take 20 mg by mouth 3 (three) times daily.    [provider]  carisoprodol (SOMA) 350 MG tablet Take 1 tablet (350 mg total) by mouth 3 (three) times daily as needed for muscle spasms. 08/02/16 08/02/17  Joni Reining, PA-C  cetirizine (ZYRTEC) 10 MG tablet Take 10 mg by mouth daily.    [provider]  cyclobenzaprine (FLEXERIL) 5 MG tablet Take 1 tablet (5 mg total) 3 (three) times daily as needed by mouth for muscle spasms. 04/27/17   Kolbe Delmonaco, Charlesetta Ivory, PA-C  diclofenac (VOLTAREN) 50 MG EC tablet Take 1 tablet (50 mg total) 2 (two) times daily by mouth. 04/27/17   Shae Augello, Charlesetta Ivory, PA-C  estrogen, conjugated,-medroxyprogesterone (PREMPRO) 0.625-2.5 MG per tablet Take 1 tablet by mouth every evening.    [provider]  ibuprofen (ADVIL,MOTRIN) 800 MG tablet Take 1 tablet (800 mg total) by mouth every 8 (eight) hours as needed for moderate pain. 03/08/15   Joni Reining, PA-C  methylPREDNISolone (MEDROL DOSEPAK) 4 MG TBPK tablet Take Tapered dose as directed 08/02/16   Joni Reining, PA-C  Olopatadine HCl (PATADAY) 0.2 % SOLN Apply 1 drop to eye 2 (two) times daily.    [provider]  omeprazole (PRILOSEC) 20 MG capsule Take 20 mg by  mouth daily.    [provider]  predniSONE (DELTASONE) 5 MG tablet Take 6 tablets (30 mg total) by mouth daily with breakfast. May take for up to 5 days.  Do not take any NSAIDs with this medication. Take with food. 07/09/16   Hagler, Jami L, PA-C    Allergies Erythromycin and Penicillins  Family History  Problem Relation Age of Onset  . COPD Mother   . COPD Father   . Heart disease Father     Social History Social  History   Tobacco Use  . Smoking status: Current Every Day Smoker  . Smokeless tobacco: Never Used  Substance Use Topics  . Alcohol use: Yes    Alcohol/week: 0.0 oz    Comment: occasionally  . Drug use: No    Review of Systems  Constitutional: Negative for fever. Cardiovascular: Negative for chest pain. Respiratory: Negative for shortness of breath. Gastrointestinal: Negative for abdominal pain, vomiting and diarrhea. Genitourinary: Negative for dysuria. Musculoskeletal: Positive for back pain. Skin: Negative for rash. Neurological: Negative for headaches, focal weakness or numbness. ____________________________________________  PHYSICAL EXAM:  VITAL SIGNS: ED Triage Vitals  Enc Vitals Group     BP 04/27/17 1923 (!) 128/96     Pulse Rate 04/27/17 1923 (!) 114     Resp 04/27/17 1923 18     Temp 04/27/17 1923 98 F (36.7 C)     Temp Source 04/27/17 1923 Oral     SpO2 04/27/17 1923 99 %     Weight 04/27/17 1926 105 lb (47.6 kg)     Height 04/27/17 1926 5\' 2"  (1.575 m)     Head Circumference --      Peak Flow --      Pain Score 04/27/17 1925 6     Pain Loc --      Pain Edu? --      Excl. in GC? --     Constitutional: Alert and oriented. Well appearing and in no distress. Patient is extremely talkative and hyperactive.  Head: Normocephalic and atraumatic. Neck: Supple. No thyromegaly. Cardiovascular: Normal rate, regular rhythm. Normal distal pulses. Respiratory: Normal respiratory effort. No wheezes/rales/rhonchi. Gastrointestinal: Soft and nontender. No distention. Musculoskeletal: Nontender with normal range of motion in all extremities.  Neurologic:  CN II-XII grossly intact Normal UE DTRs bilaterally. Normal gait without ataxia. Normal speech and language. No gross focal neurologic deficits are appreciated. Skin:  Skin is warm, dry and intact. No rash noted. Psychiatric: Mood and affect are normal. Patient exhibits appropriate insight and  judgment. ____________________________________________  INITIAL IMPRESSION / ASSESSMENT AND PLAN / ED COURSE  Patient with ED evaluation of acute on chronic pain.  Patient without any recent injury, accident, or trauma is requesting pain management at this time.  Patient has a history of chronic pain secondary to degenerative disc disease of the cervical spine.  She also has osteoarthritis.  She is discharged with prescriptions for Voltaren and cyclobenzaprine dose as directed.  She will follow-up with her primary care provider for ongoing symptom management.  I reviewed the patient's prescription history over the last 12 months in the multi-state controlled substances database(s) that includes [place], [place], [place], [place], [place], [place], [place], [place], [place], [place], [place], [place], [place], and [place].  Results were notable for her monthly prescriptions for Adderall and alprazolam.  ____________________________________________  FINAL CLINICAL IMPRESSION(S) / ED DIAGNOSES  Final diagnoses:  Other chronic pain     Ryver Poblete, Charlesetta Ivory, PA-C 04/28/17 0003  Sharman CheekStafford, Phillip, MD 05/02/17 2102

## 2017-04-27 NOTE — Discharge Instructions (Signed)
Your exam is essentially normal at this time. Take the prescription meds as directed. Follow-up with Dr. Clint GuyLindley for ongoing management.

## 2017-04-27 NOTE — ED Triage Notes (Signed)
Pt arrived via pov with complaints of head, neck, shoulder, and elbow pain. Pt states she does have intermittent pain with breathing but denies any current chest pain or shortness of breath. Pt states 1 week ago pt had "snap" of her neck that has causes an increase of pain. Pt is able to move all four extremities and is in no apparent distress.

## 2017-04-27 NOTE — ED Notes (Signed)
Pt to the er for pain that is moving around for the last 3 days. Pain was in the left shoulder, right shoulder, neck, behind the left breast. Denies chest pain. Pt says she has pain with a deep breath in the collarbone. Pt has scoliosis, vertebrae are rubbing together, arthritis and right elbow has "been giving her a fit". Pt has pain all over. Pt has not been to her md in the last year because they cant help her due to the "opioid thing". Pt also has fibromyalgia. Pt takes ibuprofen 1600mg  at a time with milk. Pt is hyper and erattic.Pain is worse with some movement but then other times is not.

## 2017-10-03 ENCOUNTER — Encounter (HOSPITAL_COMMUNITY): Payer: Self-pay | Admitting: Emergency Medicine

## 2017-10-03 ENCOUNTER — Emergency Department (HOSPITAL_COMMUNITY)
Admission: EM | Admit: 2017-10-03 | Discharge: 2017-10-03 | Disposition: A | Discharge status: Home / Self Care | Payer: Medicaid Other | Attending: Emergency Medicine | Admitting: Emergency Medicine

## 2017-10-03 ENCOUNTER — Other Ambulatory Visit: Payer: Self-pay

## 2017-10-03 DIAGNOSIS — M542 Cervicalgia: Secondary | ICD-10-CM | POA: Diagnosis not present

## 2017-10-03 DIAGNOSIS — M25511 Pain in right shoulder: Secondary | ICD-10-CM | POA: Diagnosis not present

## 2017-10-03 DIAGNOSIS — Z5321 Procedure and treatment not carried out due to patient leaving prior to being seen by health care provider: Secondary | ICD-10-CM | POA: Insufficient documentation

## 2017-10-03 NOTE — ED Triage Notes (Signed)
Pt states " I had to mow my own yard" and now c/o of right shoulder, back, neck and hip pain since today.
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
## 2017-10-03 NOTE — ED Notes (Signed)
Pt exhibits tenderness to palpation across the trapezius muscle bilaterally with light pressure.  States it is difficult to turn head due to pain.  Started after push mowing yard a few days ago.  No neuro deficit.  No weakness, equal grips.

## 2018-02-08 ENCOUNTER — Emergency Department: Payer: Medicaid Other

## 2018-02-08 ENCOUNTER — Emergency Department
Admission: EM | Admit: 2018-02-08 | Discharge: 2018-02-09 | Disposition: A | Discharge status: Home / Self Care | Payer: Medicaid Other | Attending: Emergency Medicine | Admitting: Emergency Medicine

## 2018-02-08 ENCOUNTER — Other Ambulatory Visit: Payer: Self-pay

## 2018-02-08 DIAGNOSIS — M542 Cervicalgia: Secondary | ICD-10-CM | POA: Insufficient documentation

## 2018-02-08 DIAGNOSIS — Z79899 Other long term (current) drug therapy: Secondary | ICD-10-CM | POA: Diagnosis not present

## 2018-02-08 DIAGNOSIS — W19XXXA Unspecified fall, initial encounter: Secondary | ICD-10-CM

## 2018-02-08 DIAGNOSIS — Y939 Activity, unspecified: Secondary | ICD-10-CM | POA: Diagnosis not present

## 2018-02-08 DIAGNOSIS — F17200 Nicotine dependence, unspecified, uncomplicated: Secondary | ICD-10-CM | POA: Diagnosis not present

## 2018-02-08 DIAGNOSIS — W01190A Fall on same level from slipping, tripping and stumbling with subsequent striking against furniture, initial encounter: Secondary | ICD-10-CM | POA: Diagnosis not present

## 2018-02-08 DIAGNOSIS — Y929 Unspecified place or not applicable: Secondary | ICD-10-CM | POA: Insufficient documentation

## 2018-02-08 DIAGNOSIS — M25561 Pain in right knee: Secondary | ICD-10-CM | POA: Insufficient documentation

## 2018-02-08 DIAGNOSIS — Y998 Other external cause status: Secondary | ICD-10-CM | POA: Insufficient documentation

## 2018-02-08 MED ORDER — MELOXICAM 15 MG PO TABS: 15.0000 mg | ORAL_TABLET | Freq: Every day | ORAL | 1 refills | Status: AC

## 2018-02-08 MED ORDER — METHOCARBAMOL 500 MG PO TABS: 500.0000 mg | ORAL_TABLET | Freq: Three times a day (TID) | ORAL | 0 refills | Status: AC | PRN

## 2018-02-08 NOTE — ED Notes (Signed)
Pt states she has a plate in her "neck" that she feels is pinching a nerve post fall. Pt with swelling noted to right lateral neck. Cms intact to all extremities.

## 2018-02-08 NOTE — ED Provider Notes (Signed)
[facility] Emergency Department Provider Note  ____________________________________________  Time seen: Approximately 9:27 PM  I have reviewed the triage vital signs and the nursing notes.   HISTORY  Chief Complaint Fall    HPI Laurie Robles is a 57 y.o. female with a history of chronic pain, presents to the emergency department with neck pain and right knee pain after a fall that occurred 1 day ago.  Patient denies weakness, radiculopathy or changes in sensation in the upper or lower extremities.  Patient sustained a knee abrasion to right knee but no other forms of skin compromise.  She did not lose consciousness during her fall.  Patient has been able to ambulate since fall.  No alleviating measures have been attempted.    Past Medical History:  Diagnosis Date  . Arthritis   . Bursitis   . Emphysema of lung (HCC)   . Osteoporosis   . Scoliosis     Patient Active Problem List   Diagnosis Date Noted  . Foot pain (Left) 09/17/2015  . Chronic shoulder pain (Location of Secondary source of pain) (Bilateral) (R>L) 09/17/2015  . Chronic lower extremity pain (intermittent) (Bilateral) (R>L) 09/17/2015  . Long term prescription opiate use 09/17/2015  . Opiate use 09/17/2015  . Encounter for therapeutic drug level monitoring 09/17/2015  . Encounter for pain management planning 09/17/2015  . Pain management 09/17/2015  . Hx of cervical spine surgery 09/17/2015  . Convicted for criminal activity (11/28/1993: Illegal position of schedule II drugs) 09/17/2015  . Chronic pain 09/13/2015  . Chronic foot pain (Location of Primary Source of Pain) (Left) 09/13/2015  . Healed Calcaneus fracture (Left) 09/13/2015  . Long term current use of opiate analgesic 09/13/2015  . Chronic neck pain (Location of Tertiary source of pain) (posterior) (Bilateral) (R>L) 09/13/2015  . Cervical radicular pain 09/13/2015  . Chronic low back pain (Bilateral) (R>L) 09/13/2015    Past  Surgical History:  Procedure Laterality Date  . CERVICAL LAMINECTOMY    . WRIST SURGERY Right    x 2    Prior to Admission medications   Medication Sig Start Date End Date Taking? Authorizing Provider  ALPRAZolam Prudy Feeler) 1 MG tablet Take 1 mg by mouth 4 (four) times daily.    [provider]  Amphetamine-Dextroamphetamine (AMPHETAMINE SALT COMBO PO) Take 20 mg by mouth 3 (three) times daily.    [provider]  cetirizine (ZYRTEC) 10 MG tablet Take 10 mg by mouth daily.    [provider]  cyclobenzaprine (FLEXERIL) 5 MG tablet Take 1 tablet (5 mg total) 3 (three) times daily as needed by mouth for muscle spasms. 04/27/17   Menshew, Charlesetta Ivory, PA-C  diclofenac (VOLTAREN) 50 MG EC tablet Take 1 tablet (50 mg total) 2 (two) times daily by mouth. 04/27/17   Menshew, Charlesetta Ivory, PA-C  estrogen, conjugated,-medroxyprogesterone (PREMPRO) 0.625-2.5 MG per tablet Take 1 tablet by mouth every evening.    [provider]  ibuprofen (ADVIL,MOTRIN) 800 MG tablet Take 1 tablet (800 mg total) by mouth every 8 (eight) hours as needed for moderate pain. 03/08/15   Joni Reining, PA-C  meloxicam (MOBIC) 15 MG tablet Take 1 tablet (15 mg total) by mouth daily for 7 days. 02/08/18 02/15/18  Orvil Feil, PA-C  methocarbamol (ROBAXIN) 500 MG tablet Take 1 tablet (500 mg total) by mouth 3 (three) times daily as needed for up to 5 days. 02/08/18 02/13/18  Orvil Feil, PA-C  methylPREDNISolone (MEDROL DOSEPAK) 4  MG TBPK tablet Take Tapered dose as directed 08/02/16   Joni Reining, PA-C  Olopatadine HCl (PATADAY) 0.2 % SOLN Apply 1 drop to eye 2 (two) times daily.    [provider]  omeprazole (PRILOSEC) 20 MG capsule Take 20 mg by mouth daily.    [provider]  predniSONE (DELTASONE) 5 MG tablet Take 6 tablets (30 mg total) by mouth daily with breakfast. May take for up to 5 days.  Do not take any NSAIDs with this medication. Take with food. 07/09/16    Hagler, Jami L, PA-C    Allergies Erythromycin and Penicillins  Family History  Problem Relation Age of Onset  . COPD Mother   . COPD Father   . Heart disease Father     Social History Social History   Tobacco Use  . Smoking status: Current Every Day Smoker  . Smokeless tobacco: Never Used  Substance Use Topics  . Alcohol use: Yes    Alcohol/week: 0.0 standard drinks    Comment: occasionally  . Drug use: No     Review of Systems  Constitutional: No fever/chills Eyes: No visual changes. No discharge ENT: No upper respiratory complaints. Cardiovascular: no chest pain. Respiratory: no cough. No SOB. Gastrointestinal: No abdominal pain.  No nausea, no vomiting.  No diarrhea.  No constipation. Musculoskeletal: Patient has right knee pain and neck pain.  Skin: Negative for rash, abrasions, lacerations, ecchymosis. Neurological: Negative for headaches, focal weakness or numbness.   __________________________________   PHYSICAL EXAM:  VITAL SIGNS: ED Triage Vitals  Enc Vitals Group     BP 02/08/18 2118 (!) 147/86     Pulse Rate 02/08/18 2118 99     Resp 02/08/18 2118 18     Temp 02/08/18 2118 98.4 F (36.9 C)     Temp Source 02/08/18 2118 Oral     SpO2 02/08/18 2118 98 %     Weight 02/08/18 2114 116 lb (52.6 kg)     Height 02/08/18 2114 5\' 2"  (1.575 m)     Head Circumference --      Peak Flow --      Pain Score 02/08/18 2113 6     Pain Loc --      Pain Edu? --      Excl. in GC? --      Constitutional: Alert and oriented. Well appearing and in no acute distress. Eyes: Conjunctivae are normal. PERRL. EOMI. Head: Atraumatic. ENT:      Ears: TMs are pearly.       Nose: No congestion/rhinnorhea.      Mouth/Throat: Mucous membranes are moist.  Neck: No stridor.  No cervical spine tenderness to palpation.  Cardiovascular: Normal rate, regular rhythm. Normal S1 and S2.  Good peripheral circulation. Respiratory: Normal respiratory effort without tachypnea or  retractions. Lungs CTAB. Good air entry to the bases with no decreased or absent breath sounds. Musculoskeletal: Full range of motion to all extremities. No gross deformities appreciated. Neurologic:  Normal speech and language. No gross focal neurologic deficits are appreciated.  Skin:  Skin is warm, dry and intact. No rash noted. Psychiatric: Mood and affect are normal. Speech and behavior are normal. Patient exhibits appropriate insight and judgement.   ____________________________________________   LABS (all labs ordered are listed, but only abnormal results are displayed)  Labs Reviewed - No data to display ____________________________________________  EKG   ____________________________________________  RADIOLOGY I personally viewed and evaluated these images as part of my medical decision making, as  well as reviewing the written report by the radiologist.    Dg Chest 2 View  Result Date: 02/08/2018 CLINICAL DATA:  Trip and fall injury Saturday night. History of emphysema. Smoker. EXAM: CHEST - 2 VIEW COMPARISON:  10/08/2007 FINDINGS: Normal heart size and pulmonary vascularity. No focal airspace disease or consolidation in the lungs. No blunting of costophrenic angles. No pneumothorax. Mediastinal contours appear intact. Mild hyperinflation. Postoperative changes in the lower cervical spine. IMPRESSION: No active disease. Electronically Signed   By: Burman NievesWilliam  Stevens M.D.   On: 02/08/2018 22:52   Dg Cervical Spine 2-3 Views  Result Date: 02/08/2018 CLINICAL DATA:  Trip and fall injury on Saturday night. No loss of consciousness. Now right-sided neck pain and right leg pain. Swelling over the right lateral neck. EXAM: CERVICAL SPINE - 2-3 VIEW COMPARISON:  CT cervical spine 02/05/2014 FINDINGS: Postoperative changes with anterior plate and screw fixation and intervertebral fusion from C4 through C7. Fused segments appear intact. Surgical hardware appears intact. No apparent change  in position since the previous study. No vertebral compression deformities. Degenerative changes with anterior bridging osteophyte from C3-C4. Limited visualization of C1-2 articulation due to overlying bones but appears intact. No focal bone lesion or bone destruction. No prevertebral soft tissue swelling. IMPRESSION: 1. Postoperative anterior plate and screw fixation from C4 through C7. Few segments appear intact. No change in appearance. 2. No acute displaced fractures identified. Electronically Signed   By: Burman NievesWilliam  Stevens M.D.   On: 02/08/2018 22:49   Dg Knee Complete 4 Views Right  Result Date: 02/08/2018 CLINICAL DATA:  Trip and fall injury on Saturday. Right leg pain. EXAM: RIGHT KNEE - COMPLETE 4+ VIEW COMPARISON:  08/06/2005 FINDINGS: Interval development of prominent tibial tubercle osteophyte with overlying soft tissue swelling. 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 Acute bruising is not excluded. There is no evidence of acute fracture or dislocation. No expansile or destructive bone lesions. No significant effusion. 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 New since previous study. No acute bony abnormalities. Electronically Signed   By: Burman NievesWilliam  Stevens M.D.   On: 02/08/2018 22:51    ____________________________________________    PROCEDURES  Procedure(s) performed:    Procedures    Medications - No data to display   ____________________________________________   INITIAL IMPRESSION / ASSESSMENT AND PLAN / ED COURSE  Pertinent labs & imaging results that were available during my care of the patient were reviewed by me and considered in my medical decision making (see chart for details).  Review of the [place] CSRS was performed in accordance of the [facility] prior to dispensing any controlled drugs.    Assessment and Plan:  Patient presents to the emergency department after a fall that  occurred 1 day ago.  Patient reported right knee and neck pain.  X-ray examination of the neck and right knee revealed no acute bony abnormality.  Overall physical exam was completely reassuring.  Patient was discharged with meloxicam and Robaxin and advised to follow-up with primary care as needed.   ____________________________________________  FINAL CLINICAL IMPRESSION(S) / ED DIAGNOSES  Final diagnoses:  Fall, initial encounter      NEW MEDICATIONS STARTED DURING THIS VISIT:  ED Discharge Orders         Ordered    meloxicam (MOBIC) 15 MG tablet  Daily     02/08/18 2321    methocarbamol (ROBAXIN) 500 MG tablet  3 times daily PRN     02/08/18 2321  This chart was dictated using voice recognition software/Dragon. Despite best efforts to proofread, errors can occur which can change the meaning. Any change was purely unintentional.    Gasper LloydWoods, Dorianna Mckiver M, PA-C 02/08/18 2334    Myrna BlazerSchaevitz, David Matthew, MD 02/08/18 [phone]

## 2018-02-08 NOTE — ED Triage Notes (Signed)
Reports fell on Saturday night after tripping over a chair.  Patient denies hitting her head or loss of consciousness.  Patient complains of right sided neck pain and right leg pain.  Patient is ambulatory with slow gait.

## 2018-02-08 NOTE — ED Notes (Signed)
Patient transported to X-ray 

## 2018-02-08 NOTE — ED Notes (Signed)
Patient to stat desk, states she had been outside because there had been a bad odor in her room.  Patient is asking for her discharge instructions.  Patient taken back to 12H to await discharge.

## 2018-02-08 NOTE — ED Notes (Signed)
Pt discharged by lea, rn.  

## 2018-02-08 NOTE — ED Notes (Signed)
PT NOT IN HALLWAY BED FOR DISCHARGE\.

## 2020-03-10 ENCOUNTER — Other Ambulatory Visit: Payer: Self-pay | Admitting: Nurse Practitioner

## 2020-03-10 DIAGNOSIS — M5442 Lumbago with sciatica, left side: Secondary | ICD-10-CM

## 2020-03-10 DIAGNOSIS — M542 Cervicalgia: Secondary | ICD-10-CM

## 2020-03-13 ENCOUNTER — Ambulatory Visit: Payer: Medicaid Other | Admitting: Pulmonary Disease

## 2020-03-29 ENCOUNTER — Telehealth (HOSPITAL_COMMUNITY): Payer: Medicaid Other

## 2020-03-30 ENCOUNTER — Ambulatory Visit: Payer: Medicaid Other

## 2020-04-17 ENCOUNTER — Other Ambulatory Visit: Payer: Medicaid Other

## 2020-06-13 ENCOUNTER — Ambulatory Visit
Admission: RE | Admit: 2020-06-13 | Discharge: 2020-06-13 | Disposition: A | Discharge status: Home / Self Care | Payer: Medicaid Other | Source: Ambulatory Visit | Attending: Nurse Practitioner | Admitting: Nurse Practitioner

## 2020-06-13 ENCOUNTER — Other Ambulatory Visit: Payer: Self-pay

## 2020-06-13 DIAGNOSIS — M5442 Lumbago with sciatica, left side: Secondary | ICD-10-CM | POA: Insufficient documentation

## 2020-06-13 DIAGNOSIS — M5441 Lumbago with sciatica, right side: Secondary | ICD-10-CM | POA: Insufficient documentation

## 2020-06-13 DIAGNOSIS — G8929 Other chronic pain: Secondary | ICD-10-CM | POA: Diagnosis present

## 2020-06-13 DIAGNOSIS — M542 Cervicalgia: Secondary | ICD-10-CM | POA: Diagnosis present

## 2021-02-26 ENCOUNTER — Encounter: Payer: Self-pay | Admitting: Emergency Medicine

## 2021-02-26 ENCOUNTER — Emergency Department
Admission: EM | Admit: 2021-02-26 | Discharge: 2021-02-26 | Disposition: A | Discharge status: Home / Self Care | Payer: Medicaid Other | Attending: Emergency Medicine | Admitting: Emergency Medicine

## 2021-02-26 ENCOUNTER — Other Ambulatory Visit: Payer: Self-pay

## 2021-02-26 DIAGNOSIS — Z5321 Procedure and treatment not carried out due to patient leaving prior to being seen by health care provider: Secondary | ICD-10-CM | POA: Insufficient documentation

## 2021-02-26 DIAGNOSIS — M436 Torticollis: Secondary | ICD-10-CM | POA: Insufficient documentation

## 2021-02-26 DIAGNOSIS — H9202 Otalgia, left ear: Secondary | ICD-10-CM | POA: Diagnosis not present

## 2021-02-26 NOTE — ED Notes (Signed)
This RN spoke with EDP Fuller Plan regarding pt care, per EDP Funke VORB for blood work. This RN notified EDP Fuller Plan that patient refusing blood work in triage.

## 2021-02-26 NOTE — ED Triage Notes (Signed)
Pt to ED via POV with c/o L sided neck pain, pt with some swelling noted to L side of neck, pain worse with palpation. Pt states pain worse with movement and turning her head to the L side. Pt states pain has been ongoing. Pt states pain to L ear if attempts to raise L arm above shoulder level. Pt tearful in triage.

## 2021-02-26 NOTE — ED Notes (Signed)
Pt states "I am leaving and going to try and go to my doctor later".  Patient is alert and oriented. Unlabored.

## 2022-02-17 ENCOUNTER — Emergency Department
Admission: EM | Admit: 2022-02-17 | Discharge: 2022-02-17 | Disposition: A | Discharge status: Home / Self Care | Payer: Medicaid Other | Attending: Emergency Medicine | Admitting: Emergency Medicine

## 2022-02-17 ENCOUNTER — Encounter: Payer: Self-pay | Admitting: Emergency Medicine

## 2022-02-17 ENCOUNTER — Emergency Department: Payer: Medicaid Other

## 2022-02-17 ENCOUNTER — Other Ambulatory Visit: Payer: Self-pay

## 2022-02-17 DIAGNOSIS — W19XXXA Unspecified fall, initial encounter: Secondary | ICD-10-CM | POA: Insufficient documentation

## 2022-02-17 DIAGNOSIS — M25511 Pain in right shoulder: Secondary | ICD-10-CM | POA: Diagnosis not present

## 2022-02-17 DIAGNOSIS — S52501A Unspecified fracture of the lower end of right radius, initial encounter for closed fracture: Secondary | ICD-10-CM

## 2022-02-17 DIAGNOSIS — S52611A Displaced fracture of right ulna styloid process, initial encounter for closed fracture: Secondary | ICD-10-CM | POA: Insufficient documentation

## 2022-02-17 DIAGNOSIS — S6991XA Unspecified injury of right wrist, hand and finger(s), initial encounter: Secondary | ICD-10-CM | POA: Diagnosis present

## 2022-02-17 DIAGNOSIS — M25521 Pain in right elbow: Secondary | ICD-10-CM | POA: Diagnosis not present

## 2022-02-17 DIAGNOSIS — S52591A Other fractures of lower end of right radius, initial encounter for closed fracture: Secondary | ICD-10-CM | POA: Insufficient documentation

## 2022-02-17 MED ORDER — OXYCODONE-ACETAMINOPHEN 5-325 MG PO TABS
1.0000 | ORAL_TABLET | Freq: Once | ORAL | Status: AC
  Administered 2022-02-17: 1 via ORAL
  Filled 2022-02-17: qty 1

## 2022-02-17 MED ORDER — OXYCODONE-ACETAMINOPHEN 5-325 MG PO TABS
1.0000 | ORAL_TABLET | Freq: Once | ORAL | Status: DC
  Filled 2022-02-17: qty 1

## 2022-02-17 MED ORDER — OXYCODONE-ACETAMINOPHEN 5-325 MG PO TABS: 1.0000 | ORAL_TABLET | ORAL | 0 refills | Status: AC | PRN

## 2022-02-17 NOTE — ED Notes (Signed)
EDT placed sugar tong spling and sling, provider assessed post splint. Pt d/c home by provider

## 2022-02-17 NOTE — Discharge Instructions (Addendum)
Follow-up with orthopedics.  Please call for an appointment.  Keep your arm in the splint until seen by orthopedics.  Wear the sling for comfort.  Apply ice to decrease swelling.  Take the pain medication as needed.  He can also take ibuprofen.  Return if worsening

## 2022-02-17 NOTE — ED Provider Notes (Signed)
[facility] Provider Note    Event Date/Time   First MD Initiated Contact with Patient 02/17/22 1514     (approximate)   History   Fall and Arm Injury   HPI {Laurie Robles is a 61 y.o. female  with hx of chronic pain, osteoporosis presents to the ER after a fall.  Pain at elbow and wrist, also right shoulder.  No head injury, no loc, states the arm is swollen but she can move her fingers       Physical Exam   Triage Vital Signs: ED Triage Vitals  Enc Vitals Group     BP 02/17/22 1504 138/80     Pulse Rate 02/17/22 1504 100     Resp 02/17/22 1504 18     Temp 02/17/22 1504 98.2 F (36.8 C)     Temp Source 02/17/22 1504 Oral     SpO2 02/17/22 1504 98 %     Weight 02/17/22 1410 125 lb 10.6 oz (57 kg)     Height 02/17/22 1410 5\' 3"  (1.6 m)     Head Circumference --      Peak Flow --      Pain Score 02/17/22 1410 10     Pain Loc --      Pain Edu? --      Excl. in GC? --     Most recent vital signs: Vitals:   02/17/22 1504  BP: 138/80  Pulse: 100  Resp: 18  Temp: 98.2 F (36.8 C)  SpO2: 98%     General: Awake, no distress.   CV:  Good peripheral perfusion. regular rate and  rhythm Resp:  Normal effort. Abd:  No distention.   Other:  Right wrist with swelling and deformity, able to move fingers, elbow nontender, right shoulder mildly tender, n/v intact    ED Results / Procedures / Treatments   Labs (all labs ordered are listed, but only abnormal results are displayed) Labs Reviewed - No data to display   EKG     RADIOLOGY Xray right shoulder, xray of right wrist    PROCEDURES:   .Ortho Injury Treatment  Date/Time: 02/17/2022 3:36 PM  Performed by: 02/19/2022, PA-C Authorized by: Faythe Ghee, PA-C   Consent:    Consent obtained:  Verbal   Consent given by:  Patient   Risks discussed:  Fracture, nerve damage, restricted joint movement, vascular damage, recurrent dislocation, irreducible dislocation and  stiffness   Alternatives discussed:  Alternative treatmentInjury location: wrist Location details: right wrist Injury type: fracture Fracture type: distal radius and ulnar styloid Pre-procedure neurovascular assessment: neurovascularly intact Pre-procedure distal perfusion: normal Pre-procedure neurological function: normal Pre-procedure range of motion: normal  Anesthesia: Local anesthesia used: no  Patient sedated: NoManipulation performed: no Immobilization: splint Splint type: sugar tong Splint Applied by: ED Tech Supplies used: cotton padding, elastic bandage and Ortho-Glass Post-procedure neurovascular assessment: post-procedure neurovascularly intact Post-procedure distal perfusion: normal Post-procedure neurological function: normal Post-procedure range of motion: normal Comments: Patient is n/v intact following splint application      MEDICATIONS ORDERED IN ED: Medications  oxyCODONE-acetaminophen (PERCOCET/ROXICET) 5-325 MG per tablet 1 tablet (has no administration in time range)  oxyCODONE-acetaminophen (PERCOCET/ROXICET) 5-325 MG per tablet 1 tablet (1 tablet Oral Given 02/17/22 1506)     IMPRESSION / MDM / ASSESSMENT AND PLAN / ED COURSE  I reviewed the triage vital signs and the nursing notes.  Differential diagnosis includes, but is not limited to, fracture, sprain, contusion , chronic pain  Patient's presentation is most consistent with acute complicated illness / injury requiring diagnostic workup.   Xray right shoulder independently reviewed and interpreted by me as being neg Xray of right wrist shows a comminuted displaced fracture of distal radius  Sugar tong ocl applied Patient to f/u with [facility], call for an appointment She was placed in a sling for comfort Comfort measures discussed Return if worsening      FINAL CLINICAL IMPRESSION(S) / ED DIAGNOSES   Final diagnoses:  Closed fracture of distal  end of right radius, unspecified fracture morphology, initial encounter  Displaced fracture of right ulna styloid process, initial encounter for closed fracture     Rx / DC Orders   ED Discharge Orders          Ordered    oxyCODONE-acetaminophen (PERCOCET) 5-325 MG tablet  Every 4 hours PRN        02/17/22 1516             Note:  This document was prepared using Dragon voice recognition software and may include unintentional dictation errors.    Faythe Ghee, PA-C 02/17/22 1539    Concha Se, MD 02/18/22 2036

## 2022-02-17 NOTE — ED Triage Notes (Signed)
Pt reports fell the other day and hurt her right shoulder. Pt states then fell again and hurt her right elbow ans wrist. Deformity noted to forearm

## 2022-04-06 ENCOUNTER — Other Ambulatory Visit: Payer: Self-pay

## 2022-04-06 ENCOUNTER — Emergency Department (HOSPITAL_COMMUNITY)
Admission: EM | Admit: 2022-04-06 | Discharge: 2022-04-06 | Disposition: A | Discharge status: Home / Self Care | Payer: Medicaid Other | Attending: Emergency Medicine | Admitting: Emergency Medicine

## 2022-04-06 ENCOUNTER — Encounter (HOSPITAL_COMMUNITY): Payer: Self-pay | Admitting: *Deleted

## 2022-04-06 ENCOUNTER — Emergency Department (HOSPITAL_COMMUNITY): Payer: Medicaid Other

## 2022-04-06 DIAGNOSIS — W010XXD Fall on same level from slipping, tripping and stumbling without subsequent striking against object, subsequent encounter: Secondary | ICD-10-CM | POA: Insufficient documentation

## 2022-04-06 DIAGNOSIS — S52531G Colles' fracture of right radius, subsequent encounter for closed fracture with delayed healing: Secondary | ICD-10-CM | POA: Diagnosis not present

## 2022-04-06 DIAGNOSIS — S6991XD Unspecified injury of right wrist, hand and finger(s), subsequent encounter: Secondary | ICD-10-CM | POA: Diagnosis present

## 2022-04-06 MED ORDER — KETOROLAC TROMETHAMINE 30 MG/ML IJ SOLN
30.0000 mg | Freq: Once | INTRAMUSCULAR | Status: DC
  Filled 2022-04-06: qty 1

## 2022-04-06 MED ORDER — MELOXICAM 15 MG PO TABS: 15.0000 mg | ORAL_TABLET | Freq: Every day | ORAL | 0 refills | Status: AC

## 2022-04-06 MED ORDER — MELOXICAM 15 MG PO TABS
15.0000 mg | ORAL_TABLET | Freq: Once | ORAL | Status: AC
  Administered 2022-04-06: 15 mg via ORAL
  Filled 2022-04-06: qty 1

## 2022-04-06 NOTE — ED Provider Notes (Signed)
[facility] EMERGENCY DEPARTMENT Provider Note   CSN: [id number] Arrival date & time: 04/06/22  1640     History  No chief complaint on file.   Laurie Robles is a 61 y.o. female with a history of chronic orthopedic pain mostly secondary to arthritis per patient's report, also history of pain management in the past presenting for evaluation of persistent pain in her right wrist.  She describes tripping and falling on her outstretched right hand on August 27 and was seen at [facility] at which time x-rays revealed a Colles' fracture with fractures of both her distal radius and ulna.  She was placed in a sugar-tong splint and referred to orthopedics.  She describes seeing an the orthopedic specialist at [facility] in [place] on more than one occasion, apparently there have been some scheduling difficulties and she endorses difficulty being able to contact them as well as she missing at least 1 appointment as well.  In any event, she has been advised she needs surgery but 6 weeks later this is not yet occurred.  She has fallen once again on the right forearm, she was in her splint but not in her sling and she is concerned about new injury at the site.  She is also requesting a new orthopedist as she states she is having great difficulty communicating with [facility] in [place].  She is not currently taking any pain medications for her injury.      The history is provided by the patient.       Home Medications Prior to Admission medications   Medication Sig Start Date End Date Taking? Authorizing Provider  meloxicam (MOBIC) 15 MG tablet Take 1 tablet (15 mg total) by mouth daily. 04/06/22  Yes Burlin Mcnair, Raynelle Fanning, PA-C  ALPRAZolam Prudy Feeler) 1 MG tablet Take 1 mg by mouth 4 (four) times daily.    [provider]  Amphetamine-Dextroamphetamine (AMPHETAMINE SALT COMBO PO) Take 20 mg by mouth 3 (three) times daily.    [provider]  cetirizine (ZYRTEC) 10 MG tablet Take 10  mg by mouth daily.    [provider]  cyclobenzaprine (FLEXERIL) 5 MG tablet Take 1 tablet (5 mg total) 3 (three) times daily as needed by mouth for muscle spasms. 04/27/17   Menshew, Charlesetta Ivory, PA-C  estrogen, conjugated,-medroxyprogesterone (PREMPRO) 0.625-2.5 MG per tablet Take 1 tablet by mouth every evening.    [provider]  Olopatadine HCl (PATADAY) 0.2 % SOLN Apply 1 drop to eye 2 (two) times daily.    [provider]  omeprazole (PRILOSEC) 20 MG capsule Take 20 mg by mouth daily.    [provider]  oxyCODONE-acetaminophen (PERCOCET) 5-325 MG tablet Take 1 tablet by mouth every 4 (four) hours as needed for severe pain. 02/17/22 02/17/23  Faythe Ghee, PA-C      Allergies    Erythromycin and Penicillins    Review of Systems   Review of Systems  Constitutional:  Negative for fever.  Musculoskeletal:  Positive for arthralgias and joint swelling. Negative for myalgias.  Neurological:  Negative for weakness and numbness.  All other systems reviewed and are negative.   Physical Exam Updated Vital Signs BP (!) 141/88 (BP Location: Left Arm)   Pulse (!) 108   Temp 98 F (36.7 C) (Oral)   Resp 15   Wt 53.1 kg   SpO2 98%   BMI 20.73 kg/m  Physical Exam Constitutional:      Appearance: She is well-developed.  HENT:  Head: Atraumatic.  Cardiovascular:     Comments: Pulses equal bilaterally Musculoskeletal:        General: Tenderness present.     Right wrist: Swelling, deformity and tenderness present. No crepitus. Decreased range of motion. Normal pulse.     Cervical back: Normal range of motion.     Comments: Skin scaly c/w splint use.  Skin:    General: Skin is warm and dry.  Neurological:     Mental Status: She is alert.     Sensory: No sensory deficit.     Motor: No weakness.     Deep Tendon Reflexes: Reflexes normal.     ED Results / Procedures / Treatments   Labs (all labs ordered are listed, but only abnormal  results are displayed) Labs Reviewed - No data to display  EKG None  Radiology DG Wrist Complete Right  Result Date: 04/06/2022 CLINICAL DATA:  Fall pain, known Colles fracture EXAM: RIGHT WRIST - COMPLETE 3+ VIEW; RIGHT HAND - COMPLETE 3+ VIEW COMPARISON:  02/17/2022 FINDINGS: Redemonstrated subacute appearing angulated fracture of the distal right radial metadiaphysis as well as a nondisplaced fracture of the ulnar styloid. No additional fracture or dislocation of the right hand or wrist. Joint spaces are preserved. IMPRESSION: 1. Redemonstrated subacute appearing angulated fracture of the distal right radial metadiaphysis as well as a nondisplaced fracture of the ulnar styloid seen acutely on examination dated 02/17/2022. 2. No additional fracture or dislocation of the right hand or wrist. Electronically Signed   By: Delanna Ahmadi M.D.   On: 04/06/2022 18:58   DG Hand Complete Right  Result Date: 04/06/2022 CLINICAL DATA:  Fall pain, known Colles fracture EXAM: RIGHT WRIST - COMPLETE 3+ VIEW; RIGHT HAND - COMPLETE 3+ VIEW COMPARISON:  02/17/2022 FINDINGS: Redemonstrated subacute appearing angulated fracture of the distal right radial metadiaphysis as well as a nondisplaced fracture of the ulnar styloid. No additional fracture or dislocation of the right hand or wrist. Joint spaces are preserved. IMPRESSION: 1. Redemonstrated subacute appearing angulated fracture of the distal right radial metadiaphysis as well as a nondisplaced fracture of the ulnar styloid seen acutely on examination dated 02/17/2022. 2. No additional fracture or dislocation of the right hand or wrist. Electronically Signed   By: Delanna Ahmadi M.D.   On: 04/06/2022 18:58    Procedures Procedures    Medications Ordered in ED Medications  meloxicam (MOBIC) tablet 15 mg (has no administration in time range)    ED Course/ Medical Decision Making/ A&P                           Medical Decision Making Imaging reviewed and  discussed with patient.  There is no acute changes, unfortunately also no healing of this injury.  It is now 9 weeks old, she is neurovascularly intact.  She is being given a referral to Dr. Greta Doom who is our orthopedic hand specialist on-call with [facility] today.  She was advised that she needs to call him on Monday to arrange an office visit.  Her sugar-tong splint was reapplied.  Meloxicam prescribed.  Amount and/or Complexity of Data Reviewed Radiology: ordered and independent interpretation performed.    Details: Reviewed and agree with impression.  Risk Prescription drug management.           Final Clinical Impression(s) / ED Diagnoses Final diagnoses:  Closed Colles' fracture of right radius with delayed healing, subsequent encounter    Rx / DC Orders ED  Discharge Orders          Ordered    meloxicam (MOBIC) 15 MG tablet  Daily        04/06/22 1908              Landis Martins 04/06/22 1912    Hayden Rasmussen, MD 04/07/22 1044

## 2022-04-06 NOTE — ED Triage Notes (Signed)
Pt wanting a second opinion on her right wrist fracture.    C/o pain- burning to right palm and radiating pain up the arm as well.

## 2022-04-06 NOTE — Discharge Instructions (Addendum)
Your x-rays are stable today, unfortunately as you already know there has been no significant healing of your injury and you will need surgery in order for this injury to heal properly.  It would be very important for you to follow-up with orthopedics for this problem.  It is also very important that you wear your splint, do not remove it, as you know any additional falls without the splint on can worsen this injury.  Ice and elevation can still be helpful for pain and swelling.  I am prescribing you strong anti-inflammatory pain reliever to also help you with your pain symptoms.

## 2022-04-08 ENCOUNTER — Telehealth: Payer: Self-pay | Admitting: Radiology

## 2022-04-08 NOTE — Telephone Encounter (Signed)
Patient called our office inquiring about being seen for a fracture that needs surgery.  She was originally seen after a fall on 02/17/22 at [facility].  She then was referred to [facility] for eval.  She was recommended to have surgery, went for surgery and at holding area was told she could not have surgery due to + cocaine in her urine drug test.  She says that is not correct, and wanted them to draw blood/cath her, and they would not.  I advised her since she has initiated treatment already with [facility] she should seek further treatment with them/follow up with them.  She has s/w Pam at [facility] and was told they were sending a referral to a [facility] specialist.  I advised her to see if she can call [facility] and check status on that referral.  Also advised her to employ assistance of her PCP if she can as well.  Patient voiced understanding.

## 2022-05-15 ENCOUNTER — Telehealth: Payer: Self-pay | Admitting: Radiology

## 2022-05-15 NOTE — Telephone Encounter (Signed)
VM from patient stating she needs to see a doctor and she needs to see one now.  She is hurting and she is mad.  I spoke to her recently, see previous note.  I have advised patient to contact either [facility] or her PCP and ask them refer her to one of the teaching hospitals.  We cannot see her here.  If she calls back, please advise her again.  Thanks.

## 2022-05-21 ENCOUNTER — Telehealth: Payer: Self-pay | Admitting: Orthopedic Surgery

## 2022-05-21 NOTE — Telephone Encounter (Signed)
Patient called back and states her arm is growing back wrong and she is in excruciating pain and no one will give her any pain medicine and her PCP will not do anymore referrals for her to see someone. She states she needs to see someone.  I advised her what our office manager has already said  to contact either [facility] or her PCP and ask them refer her to one of the teaching hospitals.  We cannot see her here   She said ok and hung up

## 2022-06-08 ENCOUNTER — Ambulatory Visit: Admit: 2022-06-08 | Discharge status: Against Medical Advice | Payer: Medicaid Other

## 2022-07-08 ENCOUNTER — Ambulatory Visit
Admission: EM | Admit: 2022-07-08 | Discharge: 2022-07-08 | Disposition: A | Discharge status: Home / Self Care | Payer: Medicaid Other

## 2022-07-08 DIAGNOSIS — S62101A Fracture of unspecified carpal bone, right wrist, initial encounter for closed fracture: Secondary | ICD-10-CM | POA: Diagnosis not present

## 2022-07-08 NOTE — ED Triage Notes (Signed)
Pain comes in with pain in her right wrist that was from a injury in August. Needs surgery on her wrist and has been told by orthopedists. Patient would like a referral to a orthopedists. Provider told her that we can not provider her a referral but we can give her primary care doctors that are available to take new patients that could give her a referral.

## 2022-07-08 NOTE — Discharge Instructions (Signed)
As discussed, please follow-up with a primary care physician as previously instructed to seek our referral for orthopedics. RICE therapy, rest, ice, compression and elevation. Apply ice for 20 minutes, remove for 1 hour, repeat is much as possible as needed for pain or swelling. Continue over-the-counter ibuprofen or Tylenol as needed for pain, fever, or general discomfort. Please consider following up with [facility] as they saw you initially.  They may be able to help you with a referral to [facility]. Follow-up as needed.

## 2022-07-08 NOTE — ED Provider Notes (Signed)
[facility]    CSN: [id number] Arrival date & time: 07/08/22  1910      History   Chief Complaint Chief Complaint  Patient presents with   Arm Injury    HPI Laurie Robles is a 62 y.o. female.   The history is provided by the patient.   Patient presents for continued right wrist pain that started from an injury in August 2023.  Patient reports pain is worsening.  Patient was scheduled to see orthopedics, but was found to have a positive drug screen prior to her surgery.  Patient was previously seen at [facility], but they will no longer agreed to see her due to a positive drug screen.  Patient was then referred to [facility] with whom she initially sought treatment.  Patient advised that she has followed up with [facility], but she is having difficulty getting a referral.  Patient also states that her PCP referred her to someone, but they cannot make a referral for her because "I was not there patient".  Patient was last seen in the emergency department on 04/06/2022 and was referred to hand specialist with [facility].  She was also prescribed meloxicam for pain at that time.  Patient is requesting a referral to orthopedics.  Patient is currently wearing a splint, but has been removing the splint since it was placed back in August.  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      Home Medications    Prior to Admission medications   Medication Sig Start Date End Date Taking? Authorizing Provider  ALPRAZolam Duanne Moron) 1 MG tablet Take 1 mg by mouth 4 (four) times daily.   Yes [provider]  ibuprofen (ADVIL) 800 MG tablet Take 800 mg by mouth 3 (three) times daily. 06/19/22  Yes [provider]  Olopatadine HCl (PATADAY) 0.2 % SOLN Apply 1 drop to eye 2 (two) times daily.   Yes [provider]  omeprazole (PRILOSEC) 20 MG capsule Take 20 mg by mouth daily.   Yes [provider]  SUDOGEST 60 MG tablet Take 60 mg by mouth every 6 (six) hours as needed for congestion. 05/15/22  Yes [provider]  Amphetamine-Dextroamphetamine (AMPHETAMINE SALT COMBO PO) Take 20 mg by mouth 3 (three) times daily.    [provider]  cetirizine (ZYRTEC) 10 MG tablet Take 10 mg by mouth  daily.    [provider]  cyclobenzaprine (FLEXERIL) 5 MG tablet Take 1 tablet (5 mg total) 3 (three) times daily as needed by mouth for muscle spasms. 04/27/17   Menshew, Dannielle Karvonen, PA-C  estrogen, conjugated,-medroxyprogesterone (PREMPRO) 0.625-2.5 MG per tablet Take 1 tablet by mouth every evening.    [provider]  meloxicam (MOBIC) 15 MG tablet Take 1 tablet (15 mg total) by mouth daily. 04/06/22   Evalee Jefferson, PA-C  oxyCODONE-acetaminophen (PERCOCET) 5-325 MG tablet Take 1 tablet by mouth every 4 (four) hours  as needed for severe pain. 02/17/22 02/17/23  Versie Starks, PA-C    Family History Family History  Problem Relation Age of Onset   COPD Mother    COPD Father    Heart disease Father     Social History Social History   Tobacco Use   Smoking status: Every Day   Smokeless tobacco: Never  Substance Use Topics   Alcohol use: Yes    Alcohol/week: 0.0 standard drinks of alcohol    Comment: occasionally   Drug use: No     Allergies   Erythromycin and Penicillins   Review of Systems Review of Systems Per HPI  Physical Exam Triage Vital Signs ED Triage Vitals  Enc Vitals Group     BP 07/08/22 1936 (!) 140/67     Pulse Rate 07/08/22 1936 (!) 131     Resp 07/08/22 1936 16     Temp 07/08/22 1936 97.8 F (36.6 C)     Temp Source 07/08/22 1936 Oral     SpO2 07/08/22 1936 96 %     Weight --      Height --      Head Circumference --      Peak Flow --      Pain Score 07/08/22 1938 5     Pain Loc --      Pain Edu? --      Excl. in [place]? --    No data found.  Updated Vital Signs BP (!) 140/67 (BP Location: Right Arm)   Pulse (!) 131   Temp 97.8 F (36.6 C) (Oral)   Resp 16   SpO2 96%   Visual Acuity Right Eye Distance:   Left Eye Distance:   Bilateral Distance:    Right Eye Near:   Left Eye Near:    Bilateral Near:     Physical Exam Vitals and nursing note reviewed.  Constitutional:      General: She is not in acute distress.    Appearance: Normal appearance.  Eyes:     Extraocular Movements: Extraocular movements intact.     Pupils: Pupils are equal, round, and reactive to light.  Cardiovascular:     Rate and Rhythm: Tachycardia present.  Pulmonary:     Effort: Pulmonary effort is normal.  Musculoskeletal:     Cervical back: Normal range of motion.  Skin:    General: Skin is warm and dry.  Neurological:     General: No focal deficit present.     Mental Status: She is alert and oriented to person, place, and time.  Psychiatric:        Mood and  Affect: Mood normal.        Behavior: Behavior normal.      UC Treatments / Results  Labs (all labs ordered are listed, but only abnormal results are displayed) Labs Reviewed - No data to display  EKG   Radiology No results found.  Procedures  Procedures (including critical care time)  Medications Ordered in UC Medications - No data to display  Initial Impression / Assessment and Plan / UC Course  I have reviewed the triage vital signs and the nursing notes.  Pertinent labs & imaging results that were available during my care of the patient were reviewed by me and considered in my medical decision making (see chart for details).  The patient presents anxious, she is tachycardic, and mildly hypertensive.  Patient with obvious deformity to the right wrist.  Sugar-tong splint was reapplied to provide more stabilization of the joint.  Patient was advised that she will need to follow-up with primary care physician for to Ortho or follow-up with [facility].  Patient was advised that based on the review of her chart, it is recommended that she see Ortho for possible surgery.  Patient was given information with primary care physician.  Patient was advised to keep the splint in place without removing it until she is seen by orthopedics.  Patient verbalizes understanding.  All questions were answered.  Patient is stable for discharge.   Final Clinical Impressions(s) / UC Diagnoses   Final diagnoses:  Closed fracture of right wrist, initial encounter     Discharge Instructions      As discussed, please follow-up with a primary care physician as previously instructed to seek our referral for orthopedics. RICE therapy, rest, ice, compression and elevation. Apply ice for 20 minutes, remove for 1 hour, repeat is much as possible as needed for pain or swelling. Continue over-the-counter ibuprofen or Tylenol as needed for pain, fever, or general discomfort. Please consider  following up with [facility] as they saw you initially.  They may be able to help you with a referral to [facility]. Follow-up as needed.     ED Prescriptions   None    PDMP not reviewed this encounter.   Abran Cantor, NP 07/09/22 1116
